# Patient Record
Sex: Female | Born: 1960 | Race: White | Hispanic: No | Marital: Married
Health system: Midwestern US, Community
[De-identification: ages and names within clinical notes are randomized; demographics above are authoritative.]

## PROBLEM LIST (undated history)

## (undated) DIAGNOSIS — Z8041 Family history of malignant neoplasm of ovary: Secondary | ICD-10-CM

## (undated) DIAGNOSIS — Z1231 Encounter for screening mammogram for malignant neoplasm of breast: Secondary | ICD-10-CM

## (undated) DIAGNOSIS — E7800 Pure hypercholesterolemia, unspecified: Secondary | ICD-10-CM

## (undated) DIAGNOSIS — R7303 Prediabetes: Secondary | ICD-10-CM

## (undated) DIAGNOSIS — K219 Gastro-esophageal reflux disease without esophagitis: Secondary | ICD-10-CM

## (undated) DIAGNOSIS — J40 Bronchitis, not specified as acute or chronic: Secondary | ICD-10-CM

## (undated) DIAGNOSIS — J189 Pneumonia, unspecified organism: Secondary | ICD-10-CM

## (undated) DIAGNOSIS — M25569 Pain in unspecified knee: Secondary | ICD-10-CM

---

## 1998-06-14 HISTORY — PX: BREAST CYST ASPIRATION: SHX578

## 2000-08-02 ENCOUNTER — Emergency Department (HOSPITAL_COMMUNITY): Admission: EM | Admit: 2000-08-02 | Discharge: 2000-08-02 | Payer: Self-pay | Admitting: Emergency Medicine

## 2000-08-05 ENCOUNTER — Emergency Department (HOSPITAL_COMMUNITY): Admission: EM | Admit: 2000-08-05 | Discharge: 2000-08-05 | Payer: Self-pay | Admitting: Emergency Medicine

## 2001-05-03 ENCOUNTER — Emergency Department (HOSPITAL_COMMUNITY): Admission: EM | Admit: 2001-05-03 | Discharge: 2001-05-03 | Payer: Self-pay

## 2002-01-18 ENCOUNTER — Emergency Department (HOSPITAL_COMMUNITY): Admission: EM | Admit: 2002-01-18 | Discharge: 2002-01-19 | Payer: Self-pay | Admitting: Emergency Medicine

## 2002-03-25 ENCOUNTER — Emergency Department (HOSPITAL_COMMUNITY): Admission: EM | Admit: 2002-03-25 | Discharge: 2002-03-25 | Payer: Self-pay | Admitting: Emergency Medicine

## 2002-03-25 ENCOUNTER — Encounter: Payer: Self-pay | Admitting: Emergency Medicine

## 2003-03-24 ENCOUNTER — Emergency Department (HOSPITAL_COMMUNITY): Admission: AD | Admit: 2003-03-24 | Discharge: 2003-03-24 | Payer: Self-pay | Admitting: Family Medicine

## 2003-03-25 ENCOUNTER — Emergency Department (HOSPITAL_COMMUNITY): Admission: AD | Admit: 2003-03-25 | Discharge: 2003-03-25 | Payer: Self-pay | Admitting: Family Medicine

## 2003-05-15 ENCOUNTER — Emergency Department (HOSPITAL_COMMUNITY): Admission: AD | Admit: 2003-05-15 | Discharge: 2003-05-15 | Payer: Self-pay | Admitting: Family Medicine

## 2003-05-17 ENCOUNTER — Emergency Department (HOSPITAL_COMMUNITY): Admission: AD | Admit: 2003-05-17 | Discharge: 2003-05-17 | Payer: Self-pay | Admitting: Family Medicine

## 2005-08-30 ENCOUNTER — Emergency Department (HOSPITAL_COMMUNITY): Admission: EM | Admit: 2005-08-30 | Discharge: 2005-08-30 | Payer: Self-pay | Admitting: Emergency Medicine

## 2005-11-11 ENCOUNTER — Emergency Department (HOSPITAL_COMMUNITY): Admission: EM | Admit: 2005-11-11 | Discharge: 2005-11-11 | Payer: Self-pay | Admitting: Family Medicine

## 2006-04-13 ENCOUNTER — Emergency Department (HOSPITAL_COMMUNITY): Admission: EM | Admit: 2006-04-13 | Discharge: 2006-04-13 | Payer: Self-pay | Admitting: Emergency Medicine

## 2007-07-12 ENCOUNTER — Emergency Department (HOSPITAL_COMMUNITY): Admission: EM | Admit: 2007-07-12 | Discharge: 2007-07-12 | Payer: Self-pay | Admitting: Family Medicine

## 2007-11-28 ENCOUNTER — Emergency Department (HOSPITAL_COMMUNITY): Admission: EM | Admit: 2007-11-28 | Discharge: 2007-11-28 | Payer: Self-pay | Admitting: Family Medicine

## 2009-03-01 ENCOUNTER — Emergency Department (HOSPITAL_COMMUNITY): Admission: EM | Admit: 2009-03-01 | Discharge: 2009-03-01 | Payer: Self-pay | Admitting: Family Medicine

## 2009-10-29 ENCOUNTER — Emergency Department (HOSPITAL_COMMUNITY): Admission: EM | Admit: 2009-10-29 | Discharge: 2009-10-29 | Payer: Self-pay | Admitting: Family Medicine

## 2010-04-28 ENCOUNTER — Emergency Department (HOSPITAL_COMMUNITY): Admission: EM | Admit: 2010-04-28 | Discharge: 2010-04-28 | Payer: Self-pay | Admitting: Family Medicine

## 2010-08-15 ENCOUNTER — Inpatient Hospital Stay (INDEPENDENT_AMBULATORY_CARE_PROVIDER_SITE_OTHER)
Admission: RE | Admit: 2010-08-15 | Discharge: 2010-08-15 | Disposition: A | Discharge status: Home / Self Care | Payer: BC Managed Care – PPO | Source: Ambulatory Visit | Attending: Family Medicine | Admitting: Family Medicine

## 2010-08-15 DIAGNOSIS — K089 Disorder of teeth and supporting structures, unspecified: Secondary | ICD-10-CM

## 2010-08-31 LAB — POCT URINALYSIS DIP (DEVICE)
Bilirubin Urine: NEGATIVE
Glucose, UA: NEGATIVE mg/dL
Hgb urine dipstick: NEGATIVE
Ketones, ur: NEGATIVE mg/dL
Nitrite: NEGATIVE
Protein, ur: NEGATIVE mg/dL
Specific Gravity, Urine: 1.02 (ref 1.005–1.030)
Urobilinogen, UA: 0.2 mg/dL (ref 0.0–1.0)
pH: 5.5 (ref 5.0–8.0)

## 2010-08-31 LAB — POCT PREGNANCY, URINE: Preg Test, Ur: NEGATIVE

## 2010-09-18 LAB — POCT URINALYSIS DIP (DEVICE)
Bilirubin Urine: NEGATIVE
Hgb urine dipstick: NEGATIVE
Nitrite: NEGATIVE
Protein, ur: NEGATIVE mg/dL
Urobilinogen, UA: 0.2 mg/dL (ref 0.0–1.0)
pH: 5 (ref 5.0–8.0)

## 2010-09-18 LAB — URINE CULTURE: Colony Count: NO GROWTH

## 2010-12-08 ENCOUNTER — Inpatient Hospital Stay (INDEPENDENT_AMBULATORY_CARE_PROVIDER_SITE_OTHER)
Admission: RE | Admit: 2010-12-08 | Discharge: 2010-12-08 | Disposition: A | Discharge status: Home / Self Care | Payer: BC Managed Care – PPO | Source: Ambulatory Visit | Attending: Emergency Medicine | Admitting: Emergency Medicine

## 2010-12-08 DIAGNOSIS — S058X9A Other injuries of unspecified eye and orbit, initial encounter: Secondary | ICD-10-CM

## 2010-12-09 ENCOUNTER — Inpatient Hospital Stay (INDEPENDENT_AMBULATORY_CARE_PROVIDER_SITE_OTHER)
Admission: RE | Admit: 2010-12-09 | Discharge: 2010-12-09 | Disposition: A | Discharge status: Home / Self Care | Payer: BC Managed Care – PPO | Source: Ambulatory Visit | Attending: Emergency Medicine | Admitting: Emergency Medicine

## 2010-12-09 DIAGNOSIS — S058X9A Other injuries of unspecified eye and orbit, initial encounter: Secondary | ICD-10-CM

## 2011-03-04 LAB — POCT RAPID STREP A: Streptococcus, Group A Screen (Direct): NEGATIVE

## 2012-09-07 ENCOUNTER — Emergency Department (HOSPITAL_COMMUNITY)
Admission: EM | Admit: 2012-09-07 | Discharge: 2012-09-07 | Disposition: A | Discharge status: Home / Self Care | Payer: PRIVATE HEALTH INSURANCE | Attending: Emergency Medicine | Admitting: Emergency Medicine

## 2012-09-07 ENCOUNTER — Emergency Department (INDEPENDENT_AMBULATORY_CARE_PROVIDER_SITE_OTHER)
Admission: EM | Admit: 2012-09-07 | Discharge: 2012-09-07 | Disposition: A | Discharge status: Nursing Home (Includes ICF) | Payer: PRIVATE HEALTH INSURANCE | Source: Home / Self Care | Attending: Emergency Medicine | Admitting: Emergency Medicine

## 2012-09-07 ENCOUNTER — Other Ambulatory Visit: Payer: Self-pay

## 2012-09-07 ENCOUNTER — Encounter (HOSPITAL_COMMUNITY): Payer: Self-pay | Admitting: Emergency Medicine

## 2012-09-07 ENCOUNTER — Encounter (HOSPITAL_COMMUNITY): Payer: Self-pay | Admitting: *Deleted

## 2012-09-07 DIAGNOSIS — F17200 Nicotine dependence, unspecified, uncomplicated: Secondary | ICD-10-CM | POA: Insufficient documentation

## 2012-09-07 DIAGNOSIS — R220 Localized swelling, mass and lump, head: Secondary | ICD-10-CM

## 2012-09-07 DIAGNOSIS — R51 Headache: Secondary | ICD-10-CM

## 2012-09-07 DIAGNOSIS — K044 Acute apical periodontitis of pulpal origin: Secondary | ICD-10-CM | POA: Insufficient documentation

## 2012-09-07 DIAGNOSIS — R55 Syncope and collapse: Secondary | ICD-10-CM

## 2012-09-07 DIAGNOSIS — K047 Periapical abscess without sinus: Secondary | ICD-10-CM

## 2012-09-07 DIAGNOSIS — R1110 Vomiting, unspecified: Secondary | ICD-10-CM

## 2012-09-07 LAB — CBC WITH DIFFERENTIAL/PLATELET
Basophils Absolute: 0 10*3/uL (ref 0.0–0.1)
HCT: 43.8 % (ref 36.0–46.0)
Lymphocytes Relative: 11 % — ABNORMAL LOW (ref 12–46)
Monocytes Absolute: 0.9 10*3/uL (ref 0.1–1.0)
Neutro Abs: 14.1 10*3/uL — ABNORMAL HIGH (ref 1.7–7.7)
Platelets: 186 10*3/uL (ref 150–400)
RDW: 13.2 % (ref 11.5–15.5)
WBC: 16.9 10*3/uL — ABNORMAL HIGH (ref 4.0–10.5)

## 2012-09-07 LAB — BASIC METABOLIC PANEL
CO2: 24 mEq/L (ref 19–32)
Chloride: 101 mEq/L (ref 96–112)
Sodium: 135 mEq/L (ref 135–145)

## 2012-09-07 MED ORDER — CLINDAMYCIN HCL 150 MG PO CAPS: 300.0000 mg | ORAL_CAPSULE | Freq: Three times a day (TID) | ORAL | Status: DC

## 2012-09-07 MED ORDER — DIPHENHYDRAMINE HCL 50 MG/ML IJ SOLN
12.5000 mg | Freq: Once | INTRAMUSCULAR | Status: AC
  Administered 2012-09-07: 12.5 mg via INTRAVENOUS
  Filled 2012-09-07: qty 1

## 2012-09-07 MED ORDER — PROCHLORPERAZINE EDISYLATE 5 MG/ML IJ SOLN
10.0000 mg | Freq: Once | INTRAMUSCULAR | Status: AC
  Administered 2012-09-07: 10 mg via INTRAVENOUS
  Filled 2012-09-07: qty 2

## 2012-09-07 MED ORDER — CLINDAMYCIN PHOSPHATE 600 MG/50ML IV SOLN
600.0000 mg | Freq: Once | INTRAVENOUS | Status: AC
  Administered 2012-09-07: 600 mg via INTRAVENOUS
  Filled 2012-09-07: qty 50

## 2012-09-07 MED ORDER — MORPHINE SULFATE 4 MG/ML IJ SOLN
4.0000 mg | Freq: Once | INTRAMUSCULAR | Status: AC
  Administered 2012-09-07: 4 mg via INTRAVENOUS
  Filled 2012-09-07: qty 1

## 2012-09-07 MED ORDER — OXYCODONE-ACETAMINOPHEN 5-325 MG PO TABS: 1.0000 | ORAL_TABLET | ORAL | Status: DC | PRN

## 2012-09-07 NOTE — ED Notes (Signed)
Pt transferred from urgent care, facial swelling, HA, dizziness, and vomiting since last night.

## 2012-09-07 NOTE — ED Notes (Signed)
PA at bedside.

## 2012-09-07 NOTE — ED Notes (Addendum)
Pt states she was seen in urgent care today with a right side tooth abscess. Pt states urgent care recommended IV antibiotics. Pt states not blood work or meds given to patient in urgent care. Pt states mouth pain has been hurting for 2 days. Pt not able keep food down. Pt states she also has a headache that has not gone down since yesterday. Pt now has swelling to the right side of face. Denies fever or sore throat. No difficulty swallowing or breathing. Pt states she also passed out his morning. Pt states no witness to the passing out. Not sure how long she was passed out.

## 2012-09-07 NOTE — ED Notes (Signed)
Pt also c/o front, lower toothache.

## 2012-09-07 NOTE — ED Provider Notes (Signed)
History     CSN: [id number]  Arrival date & time 09/07/12  1716   First MD Initiated Contact with Patient 09/07/12 1721      Chief Complaint  Patient presents with  . Facial Swelling    right facial swelling started last p.m due to dental problem.: headaches, nausea and vomiting that started today. fainting spell this a.m around 6:15    (Consider location/radiation/quality/duration/timing/severity/associated sxs/prior treatment) HPI Comments: Patient presents urgent care this evening complaining of and worsening right-sided facial swelling that extends to her right for head area from her maxillary region. Patient has vomited several times during the course of the day has been having a severe headache and feeling nauseous. Patient also describes that this morning she had a fainting spell have been taking Tylenol for her pain but it's not working. Patient denies any fevers. Describes it she currently feels nauseous and the swelling has gotten progressively worse in the last couple of hours. Patient looks ill  Patient is a 52 y.o. female presenting with tooth pain. The history is provided by the patient and a relative.  Dental PainThe primary symptoms include mouth pain and headaches. Primary symptoms do not include fever, shortness of breath, sore throat or cough. The symptoms began 12 to 24 hours ago. The symptoms are worsening. The symptoms occur constantly.  The headache is not associated with neck stiffness or weakness.  Additional symptoms include: dental sensitivity to temperature, gum swelling, gum tenderness, facial swelling and excessive salivation. Additional symptoms do not include: taste disturbance, ear pain, hearing loss, swollen glands, goiter and fatigue. Medical issues include: periodontal disease.    History reviewed. No pertinent past medical history.  History reviewed. No pertinent past surgical history.  History reviewed. No pertinent family history.  History    Substance Use Topics  . Smoking status: Not on file  . Smokeless tobacco: Not on file  . Alcohol Use: Not on file    OB History   Grav Para Term Preterm Abortions TAB SAB Ect Mult Living                  Review of Systems  Constitutional: Positive for activity change and appetite change. Negative for fever, chills, diaphoresis and fatigue.  HENT: Positive for facial swelling and dental problem. Negative for hearing loss, ear pain, sore throat, rhinorrhea, sneezing, mouth sores, neck pain, neck stiffness, postnasal drip, sinus pressure and ear discharge.   Respiratory: Negative for cough and shortness of breath.   Gastrointestinal: Positive for nausea and vomiting. Negative for abdominal pain.  Skin: Negative for color change and rash.  Neurological: Positive for dizziness, syncope, facial asymmetry and headaches. Negative for tremors, seizures, speech difficulty, weakness, light-headedness and numbness.    Allergies  Review of patient's allergies indicates no known allergies.  Home Medications  No current outpatient prescriptions on file.  There were no vitals taken for this visit.  Physical Exam  Nursing note and vitals reviewed. Constitutional: She is oriented to person, place, and time. Vital signs are normal.  Non-toxic appearance. She does not have a sickly appearance. She appears ill. She appears distressed.  HENT:  Head: Normocephalic.    Mouth/Throat:    Eyes: Left eye exhibits no discharge. No scleral icterus.  Neck: Neck supple.  Neurological: She is alert and oriented to person, place, and time.  Skin: No rash noted. No erythema.    ED Course  Procedures (including critical care time)  Labs Reviewed - No data to display No  results found.   1. Abscess, dental   2. Facial swelling   3. Vomiting   4. Syncopal episodes       MDM  Significant facial swelling from a dental abscess. Patient is also experiencing a severe headache associated with  nausea and vomiting. And is reporting a syncopal episode at home. Patient will be transferred to the emergency department for consideration of IV antibiotics and further monitoring observation and oral surgeon consult-  Jimmie Molly, MD 09/07/12 1901

## 2012-09-07 NOTE — ED Notes (Signed)
Pt c/o right sided facial swelling that started last night due to dental problem with the upper right tooth  Headache. Nausea. And vomiting. That started today. Pt states that she had a fainting spell this a.m around 6:15 a.m  Pt has been taking tylenol 3 for pain last dose was at 11:55 p.m and just recently finished antibiotic for a sinus infection.

## 2012-09-07 NOTE — ED Provider Notes (Signed)
History     CSN: [id number]  Arrival date & time 09/07/12  1910   First MD Initiated Contact with Patient 09/07/12 2015      Chief Complaint  Patient presents with  . [facility] transfer   . Facial Swelling    (Consider location/radiation/quality/duration/timing/severity/associated sxs/prior treatment) HPI History provided by pt.   Pt transferred from [facility]; multiple complaints today.  Has had a right upper toothache for the past two days.  Increased in severity over night, and woke this morning w/ right facial edema.  Pain is worst along right side of nose currently.  No relief w/ tylenol #3.  Developed a constant, throbbing pain on top of head over night as well.  Gradual in onset. No associated fever, vision changes, photo/phonophobia, dizziness, extremity weakness/paresthesias.  H/o migraines but this pain feels worse.  Denies head trauma.  Also c/o syncopal episode that occurred this morning while walking to her bedroom.  Preceded by lightheadedness and vision going black.  Unwitnessed.  No prior history.  Had severe facial pain at time of episode.  Has not had CP, SOB or palpitations, and other than dental pain/facial edema, no recent illnesses.  No recent medication changes.   History reviewed. No pertinent past medical history.  Past Surgical History  Procedure Laterality Date  . Cesarean section      History reviewed. No pertinent family history.  History  Substance Use Topics  . Smoking status: Current Every Day Smoker -- 0.50 packs/day    Types: Cigarettes  . Smokeless tobacco: Not on file  . Alcohol Use: No    OB History   Grav Para Term Preterm Abortions TAB SAB Ect Mult Living                  Review of Systems  All other systems reviewed and are negative.    Allergies  Novocain  Home Medications  No current outpatient prescriptions on file.  BP 152/71  Pulse 84  Temp(Src) 98.2 F (36.8 C) (Oral)  Resp 16  SpO2 96%  Physical Exam  Nursing note and  vitals reviewed. Constitutional: She is oriented to person, place, and time. She appears well-developed and well-nourished.  HENT:  Head: Normocephalic and atraumatic.  Induration and edema of cheek, along right side of nose.  Ttp.  No tenderness of inferior orbit or pain w/ ROM of EOMs.  Diffuse, advanced caries.  Pt points to pain at right upper lateral incisor.  Mild ttp of this tooth.  Mild adjacent gingivitis.  No trismus.     Eyes:  Normal appearance  Neck: Normal range of motion. Neck supple. No rigidity. No Brudzinski's sign and no Kernig's sign noted.  Cardiovascular: Normal rate, regular rhythm and intact distal pulses.   Pulmonary/Chest: Effort normal and breath sounds normal.  Abdominal: Soft. Bowel sounds are normal. She exhibits no distension. There is no tenderness.  Musculoskeletal: Normal range of motion.  Lymphadenopathy:    She has no cervical adenopathy.  Neurological: She is alert and oriented to person, place, and time. No sensory deficit. Coordination normal.  CN 3-12 intact.  No nystagmus.  5/5 and equal upper and lower extremity strength.  No past pointing.    Skin: Skin is warm and dry. No rash noted.  Psychiatric: She has a normal mood and affect. Her behavior is normal.    ED Course  Procedures (including critical care time)   Date: 09/07/2012  Rate: 95  Rhythm: normal sinus rhythm  QRS Axis: normal  Intervals: normal  ST/T Wave abnormalities: normal  Conduction Disutrbances:none  Narrative Interpretation:   Old EKG Reviewed: none available   Labs Reviewed  CBC WITH DIFFERENTIAL - Abnormal; Notable for the following:    WBC 16.9 (*)    Hemoglobin 15.3 (*)    Neutrophils Relative 84 (*)    Neutro Abs 14.1 (*)    Lymphocytes Relative 11 (*)    All other components within normal limits  BASIC METABOLIC PANEL - Abnormal; Notable for the following:    Glucose, Bld 101 (*)    All other components within normal limits   No results found.   1.  Dental infection   2. Syncope   3. Headache       MDM  Pt transferred from [facility] for treatment of suspected periapical abscess w/ significant right facial edema, as well as non-traumatic headache and syncopal episode.  Syncope preceded by lightheadedness and vision going black; occurred in setting of severe facial pain; no recent CP/SOB/palpitations, other recent illnesses or recent medication changes.  Suspect that it was vasovagal and related to pain, but EKG and basic labs pending.  Pt well appearing, afebrile, no focal neuro deficits or meningeal signs on exam.  Will treat headache w/ IV compazine.  She is also receiving IV clinda for dental infection.    EKG shows NSR and is non-ischemic.  Labs unremarkable w/ exception of leukocytosis.  Headache has resolved and facial pain much improved.  VSS.  Referred to dentist on call and prescribed clinda and percocet.  Return precautions discussed. 10:53 PM         Otilio Miu, PA-C 09/07/12 [phone]

## 2012-09-07 NOTE — ED Notes (Signed)
Pt sent to the emergency department for consideration of IV antibiotics and further monitoring observation and oral surgeon consult-

## 2012-09-08 NOTE — ED Provider Notes (Signed)
Medical screening examination/treatment/procedure(s) were performed by non-physician practitioner and as supervising physician I was immediately available for consultation/collaboration.   Gwyneth Sprout, MD 09/08/12 [phone]

## 2013-07-24 ENCOUNTER — Emergency Department (HOSPITAL_COMMUNITY)
Admission: EM | Admit: 2013-07-24 | Discharge: 2013-07-24 | Disposition: A | Discharge status: Home / Self Care | Payer: PRIVATE HEALTH INSURANCE | Source: Home / Self Care | Attending: Family Medicine | Admitting: Family Medicine

## 2013-07-24 ENCOUNTER — Encounter (HOSPITAL_COMMUNITY): Payer: Self-pay | Admitting: Emergency Medicine

## 2013-07-24 DIAGNOSIS — A084 Viral intestinal infection, unspecified: Secondary | ICD-10-CM

## 2013-07-24 DIAGNOSIS — A088 Other specified intestinal infections: Secondary | ICD-10-CM

## 2013-07-24 MED ORDER — ONDANSETRON 4 MG PO TBDP
8.0000 mg | ORAL_TABLET | Freq: Once | ORAL | Status: AC
  Administered 2013-07-24: 8 mg via ORAL

## 2013-07-24 MED ORDER — ONDANSETRON 4 MG PO TBDP
ORAL_TABLET | ORAL | Status: AC
  Filled 2013-07-24: qty 2

## 2013-07-24 MED ORDER — ONDANSETRON HCL 4 MG PO TABS: 4.0000 mg | ORAL_TABLET | Freq: Four times a day (QID) | ORAL | Status: DC

## 2013-07-24 NOTE — ED Provider Notes (Signed)
CSN: [id number]     Arrival date & time 07/24/13  1017 History   None    Chief Complaint  Patient presents with  . Abdominal Pain     (Consider location/radiation/quality/duration/timing/severity/associated sxs/prior Treatment) Patient is a 53 y.o. female presenting with abdominal pain. The history is provided by the patient.  Abdominal Pain Pain location:  Generalized Pain quality: cramping   Pain radiates to:  Does not radiate Pain severity:  Mild Onset quality:  Gradual Duration:  5 days Timing:  Intermittent Progression:  Improving Chronicity:  New Associated symptoms: diarrhea, nausea and vomiting   Associated symptoms: no chills, no fever, no hematemesis and no hematochezia     History reviewed. No pertinent past medical history. Past Surgical History  Procedure Laterality Date  . Cesarean section     No family history on file. History  Substance Use Topics  . Smoking status: Current Every Day Smoker -- 0.50 packs/day    Types: Cigarettes  . Smokeless tobacco: Not on file  . Alcohol Use: No   OB History   Grav Para Term Preterm Abortions TAB SAB Ect Mult Living                 Review of Systems  Constitutional: Positive for activity change and appetite change. Negative for fever and chills.  Gastrointestinal: Positive for nausea, vomiting, abdominal pain and diarrhea. Negative for hematochezia and hematemesis.  Genitourinary: Negative.   Skin: Negative.       Allergies  Novocain  Home Medications   Current Outpatient Rx  Name  Route  Sig  Dispense  Refill  . Aspirin-Salicylamide-Caffeine (BC HEADACHE POWDER PO)   Oral   Take by mouth.         . clindamycin (CLEOCIN) 150 MG capsule   Oral   Take 2 capsules (300 mg total) by mouth 3 (three) times daily.   42 capsule   0   . ondansetron (ZOFRAN) 4 MG tablet   Oral   Take 1 tablet (4 mg total) by mouth every 6 (six) hours. Prn n/v   6 tablet   0   . oxyCODONE-acetaminophen  (PERCOCET/ROXICET) 5-325 MG per tablet   Oral   Take 1 tablet by mouth every 4 (four) hours as needed for pain.   20 tablet   0    BP 139/82  Pulse 74  Temp(Src) 97.7 F (36.5 C) (Oral)  Resp 20  SpO2 95% Physical Exam  Nursing note and vitals reviewed. Constitutional: She is oriented to person, place, and time. She appears well-developed and well-nourished.  HENT:  Mouth/Throat: Oropharynx is clear and moist.  Neck: Normal range of motion. Neck supple.  Abdominal: Soft. Bowel sounds are normal. She exhibits no distension and no mass. There is no tenderness. There is no rebound and no guarding.  Lymphadenopathy:    She has no cervical adenopathy.  Neurological: She is alert and oriented to person, place, and time.  Skin: Skin is warm and dry.    ED Course  Procedures (including critical care time) Labs Review Labs Reviewed - No data to display Imaging Review No results found.    MDM   Final diagnoses:  Gastroenteritis and colitis, viral      Linna HoffJames D Jany Buckwalter, MD 07/25/13 2014

## 2013-07-24 NOTE — ED Notes (Signed)
Reports abdominal pain, n/v/d started on Wednesday.  Vomiting stopped on Sunday, diarrhea ended yesterday, today continued abdominal pain and nausea

## 2013-07-24 NOTE — Discharge Instructions (Signed)
Clear liquid , bland diet tonight as tolerated, advance on wed as improved, use medicine as needed, probiotic-Align or activia yogurt,  return or see your doctor if any problems.

## 2013-08-13 ENCOUNTER — Emergency Department (HOSPITAL_COMMUNITY)
Admission: EM | Admit: 2013-08-13 | Discharge: 2013-08-13 | Disposition: A | Discharge status: Home / Self Care | Payer: PRIVATE HEALTH INSURANCE | Source: Home / Self Care

## 2013-08-13 ENCOUNTER — Encounter (HOSPITAL_COMMUNITY): Payer: Self-pay | Admitting: Emergency Medicine

## 2013-08-13 DIAGNOSIS — R059 Cough, unspecified: Secondary | ICD-10-CM

## 2013-08-13 DIAGNOSIS — Z720 Tobacco use: Secondary | ICD-10-CM

## 2013-08-13 DIAGNOSIS — J069 Acute upper respiratory infection, unspecified: Secondary | ICD-10-CM

## 2013-08-13 DIAGNOSIS — J9801 Acute bronchospasm: Secondary | ICD-10-CM

## 2013-08-13 DIAGNOSIS — F17200 Nicotine dependence, unspecified, uncomplicated: Secondary | ICD-10-CM

## 2013-08-13 DIAGNOSIS — R05 Cough: Secondary | ICD-10-CM

## 2013-08-13 MED ORDER — IPRATROPIUM-ALBUTEROL 0.5-2.5 (3) MG/3ML IN SOLN
3.0000 mL | Freq: Once | RESPIRATORY_TRACT | Status: AC
  Administered 2013-08-13: 3 mL via RESPIRATORY_TRACT

## 2013-08-13 MED ORDER — HYDROCODONE-ACETAMINOPHEN 5-325 MG PO TABS
ORAL_TABLET | ORAL | Status: AC
  Filled 2013-08-13: qty 1

## 2013-08-13 MED ORDER — HYDROCODONE-ACETAMINOPHEN 5-325 MG PO TABS
1.0000 | ORAL_TABLET | Freq: Once | ORAL | Status: AC
  Administered 2013-08-13: 1 via ORAL

## 2013-08-13 MED ORDER — KETOROLAC TROMETHAMINE 30 MG/ML IJ SOLN
INTRAMUSCULAR | Status: AC
  Filled 2013-08-13: qty 1

## 2013-08-13 MED ORDER — PREDNISONE 20 MG PO TABS: ORAL_TABLET | ORAL | Status: DC

## 2013-08-13 MED ORDER — ALBUTEROL SULFATE HFA 108 (90 BASE) MCG/ACT IN AERS: 2.0000 | INHALATION_SPRAY | RESPIRATORY_TRACT | Status: DC | PRN

## 2013-08-13 MED ORDER — IPRATROPIUM-ALBUTEROL 0.5-2.5 (3) MG/3ML IN SOLN
RESPIRATORY_TRACT | Status: AC
  Filled 2013-08-13: qty 3

## 2013-08-13 MED ORDER — TRIAMCINOLONE ACETONIDE 40 MG/ML IJ SUSP
40.0000 mg | Freq: Once | INTRAMUSCULAR | Status: AC
  Administered 2013-08-13: 40 mg via INTRAMUSCULAR

## 2013-08-13 MED ORDER — TRIAMCINOLONE ACETONIDE 40 MG/ML IJ SUSP
INTRAMUSCULAR | Status: AC
  Filled 2013-08-13: qty 1

## 2013-08-13 MED ORDER — ALBUTEROL SULFATE (2.5 MG/3ML) 0.083% IN NEBU
INHALATION_SOLUTION | RESPIRATORY_TRACT | Status: AC
  Filled 2013-08-13: qty 3

## 2013-08-13 MED ORDER — KETOROLAC TROMETHAMINE 30 MG/ML IJ SOLN
30.0000 mg | Freq: Once | INTRAMUSCULAR | Status: AC
  Administered 2013-08-13: 30 mg via INTRAMUSCULAR

## 2013-08-13 MED ORDER — ALBUTEROL SULFATE (2.5 MG/3ML) 0.083% IN NEBU
2.5000 mg | INHALATION_SOLUTION | Freq: Once | RESPIRATORY_TRACT | Status: AC
  Administered 2013-08-13: 2.5 mg via RESPIRATORY_TRACT

## 2013-08-13 NOTE — ED Provider Notes (Signed)
CSN: [id number]     Arrival date & time 08/13/13  1728 History   First MD Initiated Contact with Patient 08/13/13 1930     Chief Complaint  Patient presents with  . Headache   (Consider location/radiation/quality/duration/timing/severity/associated sxs/prior Treatment) HPI Comments: 53 year old female appears 10 years older than stated age is complaining of headache, cough and upper respiratory congestion After spraining the bathroom and Lysol last night. She has a long history of smoking, but never  diagnosed with COPD.   History reviewed. No pertinent past medical history. Past Surgical History  Procedure Laterality Date  . Cesarean section     No family history on file. History  Substance Use Topics  . Smoking status: Current Every Day Smoker -- 0.50 packs/day    Types: Cigarettes  . Smokeless tobacco: Not on file  . Alcohol Use: No   OB History   Grav Para Term Preterm Abortions TAB SAB Ect Mult Living                 Review of Systems  Constitutional: Positive for activity change. Negative for fever, chills, appetite change and fatigue.  HENT: Positive for congestion, postnasal drip and rhinorrhea. Negative for facial swelling and sore throat.   Eyes: Negative.   Respiratory: Positive for cough.   Cardiovascular: Negative.   Gastrointestinal: Negative.   Genitourinary: Negative.   Musculoskeletal: Negative for neck pain and neck stiffness.  Skin: Negative for pallor and rash.  Neurological: Positive for headaches. Negative for tremors, syncope, speech difficulty, light-headedness and numbness.       H/A located frontal region.  No focal weakness or paresthesias No problems with Vision, speech, hearing or swallowing.     Allergies  Novocain  Home Medications   Current Outpatient Rx  Name  Route  Sig  Dispense  Refill  . albuterol (PROVENTIL HFA;VENTOLIN HFA) 108 (90 BASE) MCG/ACT inhaler   Inhalation   Inhale 2 puffs into the lungs every 4 (four) hours as  needed for wheezing or shortness of breath.   1 Inhaler   0   . Aspirin-Salicylamide-Caffeine (BC HEADACHE POWDER PO)   Oral   Take by mouth.         . clindamycin (CLEOCIN) 150 MG capsule   Oral   Take 2 capsules (300 mg total) by mouth 3 (three) times daily.   42 capsule   0   . ondansetron (ZOFRAN) 4 MG tablet   Oral   Take 1 tablet (4 mg total) by mouth every 6 (six) hours. Prn n/v   6 tablet   0   . oxyCODONE-acetaminophen (PERCOCET/ROXICET) 5-325 MG per tablet   Oral   Take 1 tablet by mouth every 4 (four) hours as needed for pain.   20 tablet   0   . predniSONE (DELTASONE) 20 MG tablet      3 tabs po x 2 days, 2 tabs q d x 3 d, 1 po x 3 d.   15 tablet   0    BP 143/80  Temp(Src) 98.4 F (36.9 C) (Oral)  SpO2 98% Physical Exam  Nursing note and vitals reviewed. Constitutional: She is oriented to person, place, and time. She appears well-developed and well-nourished. No distress.  HENT:  Mouth/Throat: Oropharynx is clear and moist.  TM's nl OP with clear PND  Eyes: Conjunctivae and EOM are normal.  Neck: Normal range of motion. Neck supple.  Cardiovascular: Normal rate, regular rhythm and normal heart sounds.   Pulmonary/Chest: Effort normal.  No respiratory distress. She has wheezes.  Prolonged expir phase Diffuse bilateral expiratory wheezing  Musculoskeletal: Normal range of motion. She exhibits no edema.  Lymphadenopathy:    She has no cervical adenopathy.  Neurological: She is alert and oriented to person, place, and time.  Skin: Skin is warm and dry. No rash noted.  Psychiatric: She has a normal mood and affect.    ED Course  Procedures (including critical care time) Labs Review Labs Reviewed - No data to display Imaging Review No results found.   MDM   1. Cough   2. Bronchospasm   3. URI (upper respiratory infection)   4. Tobacco abuse     1805 hours: Patient is completed her DuoNeb and states that she had minimal relief. She can  still feel the shortness of breath and wheeze. Auscultation reveals improved air movement and a decrease with wheeze but poorly she could do better with a second albuterol neb. Received injection of Toradol 30 mg and Tylenol 40 mg IM Norco 5 mg po. Improved air movement , no wheezes after 2nd albuterol neb. Rx alb HFA Prednisone taper Alka Seltzer Cold Plus Night for URI Ibuprofen    Hayden Rasmussenavid Shiro Ellerman, NP 08/13/13 2043

## 2013-08-13 NOTE — Discharge Instructions (Signed)
Bronchospasm, Adult A bronchospasm is a spasm or tightening of the airways going into the lungs. During a bronchospasm breathing becomes more difficult because the airways get smaller. When this happens there can be coughing, a whistling sound when breathing (wheezing), and difficulty breathing. Bronchospasm is often associated with asthma, but not all patients who experience a bronchospasm have asthma. CAUSES  A bronchospasm is caused by inflammation or irritation of the airways. The inflammation or irritation may be triggered by:   Allergies (such as to animals, pollen, food, or mold). Allergens that cause bronchospasm may cause wheezing immediately after exposure or many hours later.   Infection. Viral infections are believed to be the most common cause of bronchospasm.   Exercise.   Irritants (such as pollution, cigarette smoke, strong odors, aerosol sprays, and paint fumes).   Weather changes. Winds increase molds and pollens in the air. Rain refreshes the air by washing irritants out. Cold air may cause inflammation.   Stress and emotional upset.  SIGNS AND SYMPTOMS   Wheezing.   Excessive nighttime coughing.   Frequent or severe coughing with a simple cold.   Chest tightness.   Shortness of breath.  DIAGNOSIS  Bronchospasm is usually diagnosed through a history and physical exam. Tests, such as chest X-rays, are sometimes done to look for other conditions. TREATMENT   Inhaled medicines can be given to open up your airways and help you breathe. The medicines can be given using either an inhaler or a nebulizer machine.  Corticosteroid medicines may be given for severe bronchospasm, usually when it is associated with asthma. HOME CARE INSTRUCTIONS   Always have a plan prepared for seeking medical care. Know when to call your health care provider and local emergency services (911 in the [place] Know where you can access local emergency care.  Only take medicines as  directed by your health care provider.  If you were prescribed an inhaler or nebulizer machine, ask your health care provider to explain how to use it correctly. Always use a spacer with your inhaler if you were given one.  It is necessary to remain calm during an attack. Try to relax and breathe more slowly.  Control your home environment in the following ways:   Change your heating and air conditioning filter at least once a month.   Limit your use of fireplaces and wood stoves.  Do not smoke and do not allow smoking in your home.   Avoid exposure to perfumes and fragrances.   Get rid of pests (such as roaches and mice) and their droppings.   Throw away plants if you see mold on them.   Keep your house clean and dust free.   Replace carpet with wood, tile, or vinyl flooring. Carpet can trap dander and dust.   Use allergy-proof pillows, mattress covers, and box spring covers.   Wash bed sheets and blankets every week in hot water and dry them in a dryer.   Use blankets that are made of polyester or cotton.   Wash hands frequently. SEEK MEDICAL CARE IF:   You have muscle aches.   You have chest pain.   The sputum changes from clear or white to yellow, green, gray, or bloody.   The sputum you cough up gets thicker.   There are problems that may be related to the medicine you are given, such as a rash, itching, swelling, or trouble breathing.  SEEK IMMEDIATE MEDICAL CARE IF:   You have worsening wheezing and coughing  even after taking your prescribed medicines.   You have increased difficulty breathing.   You develop severe chest pain. MAKE SURE YOU:   Understand these instructions.  Will watch your condition.  Will get help right away if you are not doing well or get worse. Document Released: 06/03/2003 Document Revised: 01/31/2013 Document Reviewed: 11/20/2012 [facility] Patient Information 2014 [place], [place].  Cough, Adult  A cough is a  reflex that helps clear your throat and airways. It can help heal the body or may be a reaction to an irritated airway. A cough may only last 2 or 3 weeks (acute) or may last more than 8 weeks (chronic).  CAUSES Acute cough:  Viral or bacterial infections. Chronic cough:  Infections.  Allergies.  Asthma.  Post-nasal drip.  Smoking.  Heartburn or acid reflux.  Some medicines.  Chronic lung problems (COPD).  Cancer. SYMPTOMS   Cough.  Fever.  Chest pain.  Increased breathing rate.  High-pitched whistling sound when breathing (wheezing).  Colored mucus that you cough up (sputum). TREATMENT   A bacterial cough may be treated with antibiotic medicine.  A viral cough must run its course and will not respond to antibiotics.  Your caregiver may recommend other treatments if you have a chronic cough. HOME CARE INSTRUCTIONS   Only take over-the-counter or prescription medicines for pain, discomfort, or fever as directed by your caregiver. Use cough suppressants only as directed by your caregiver.  Use a cold steam vaporizer or humidifier in your bedroom or home to help loosen secretions.  Sleep in a semi-upright position if your cough is worse at night.  Rest as needed.  Stop smoking if you smoke. SEEK IMMEDIATE MEDICAL CARE IF:   You have pus in your sputum.  Your cough starts to worsen.  You cannot control your cough with suppressants and are losing sleep.  You begin coughing up blood.  You have difficulty breathing.  You develop pain which is getting worse or is uncontrolled with medicine.  You have a fever. MAKE SURE YOU:   Understand these instructions.  Will watch your condition.  Will get help right away if you are not doing well or get worse. Document Released: 11/27/2010 Document Revised: 08/23/2011 Document Reviewed: 11/27/2010 [facility] Patient Information 2014 [place], [place].  How to Use an Inhaler Proper inhaler technique is very  important. Good technique ensures that the medicine reaches the lungs. Poor technique results in depositing the medicine on the tongue and back of the throat rather than in the airways. If you do not use the inhaler with good technique, the medicine will not help you. STEPS TO FOLLOW IF USING AN INHALER WITHOUT AN EXTENSION TUBE 1. Remove the cap from the inhaler. 2. If you are using the inhaler for the first time, you will need to prime it. Shake the inhaler for 5 seconds and release four puffs into the air, away from your face. Ask your health care provider or pharmacist if you have questions about priming your inhaler. 3. Shake the inhaler for 5 seconds before each breath in (inhalation). 4. Position the inhaler so that the top of the canister faces up. 5. Put your index finger on the top of the medicine canister. Your thumb supports the bottom of the inhaler. 6. Open your mouth. 7. Either place the inhaler between your teeth and place your lips tightly around the mouthpiece, or hold the inhaler 1 2 inches away from your open mouth. If you are unsure of which technique to  use, ask your health care provider. 8. Breathe out (exhale) normally and as completely as possible. 9. Press the canister down with your index finger to release the medicine. 10. At the same time as the canister is pressed, inhale deeply and slowly until your lungs are completely filled. This should take 4 6 seconds. Keep your tongue down. 11. Hold the medicine in your lungs for 5 10 seconds (10 seconds is best). This helps the medicine get into the small airways of your lungs. 12. Breathe out slowly, through pursed lips. Whistling is an example of pursed lips. 13. Wait at least 15 30 seconds between puffs. Continue with the above steps until you have taken the number of puffs your health care provider has ordered. Do not use the inhaler more than your health care provider tells you. 14. Replace the cap on the inhaler. 15. Follow  the directions from your health care provider or the inhaler insert for cleaning the inhaler. STEPS TO FOLLOW IF USING AN INHALER WITH AN EXTENSION (SPACER) 1. Remove the cap from the inhaler. 2. If you are using the inhaler for the first time, you will need to prime it. Shake the inhaler for 5 seconds and release four puffs into the air, away from your face. Ask your health care provider or pharmacist if you have questions about priming your inhaler. 3. Shake the inhaler for 5 seconds before each breath in (inhalation). 4. Place the open end of the spacer onto the mouthpiece of the inhaler. 5. Position the inhaler so that the top of the canister faces up and the spacer mouthpiece faces you. 6. Put your index finger on the top of the medicine canister. Your thumb supports the bottom of the inhaler and the spacer. 7. Breathe out (exhale) normally and as completely as possible. 8. Immediately after exhaling, place the spacer between your teeth and into your mouth. Close your lips tightly around the spacer. 9. Press the canister down with your index finger to release the medicine. 10. At the same time as the canister is pressed, inhale deeply and slowly until your lungs are completely filled. This should take 4 6 seconds. Keep your tongue down and out of the way. 11. Hold the medicine in your lungs for 5 10 seconds (10 seconds is best). This helps the medicine get into the small airways of your lungs. Exhale. 12. Repeat inhaling deeply through the spacer mouthpiece. Again hold that breath for up to 10 seconds (10 seconds is best). Exhale slowly. If it is difficult to take this second deep breath through the spacer, breathe normally several times through the spacer. Remove the spacer from your mouth. 13. Wait at least 15 30 seconds between puffs. Continue with the above steps until you have taken the number of puffs your health care provider has ordered. Do not use the inhaler more than your health care  provider tells you. 14. Remove the spacer from the inhaler, and place the cap on the inhaler. 15. Follow the directions from your health care provider or the inhaler insert for cleaning the inhaler and spacer. If you are using different kinds of inhalers, use your quick relief medicine to open the airways 10 15 minutes before using a steroid if instructed to do so by your health care provider. If you are unsure which inhalers to use and the order of using them, ask your health care provider, nurse, or respiratory therapist. If you are using a steroid inhaler, always rinse your mouth  with water after your last puff, then gargle and spit out the water. Do not swallow the water. AVOID:  Inhaling before or after starting the spray of medicine. It takes practice to coordinate your breathing with triggering the spray.  Inhaling through the nose (rather than the mouth) when triggering the spray. HOW TO DETERMINE IF YOUR INHALER IS FULL OR NEARLY EMPTY You cannot know when an inhaler is empty by shaking it. A few inhalers are now being made with dose counters. Ask your health care provider for a prescription that has a dose counter if you feel you need that extra help. If your inhaler does not have a counter, ask your health care provider to help you determine the date you need to refill your inhaler. Write the refill date on a calendar or your inhaler canister. Refill your inhaler 7 10 days before it runs out. Be sure to keep an adequate supply of medicine. This includes making sure it is not expired, and that you have a spare inhaler.  SEEK MEDICAL CARE IF:   Your symptoms are only partially relieved with your inhaler.  You are having trouble using your inhaler.  You have some increase in phlegm. SEEK IMMEDIATE MEDICAL CARE IF:   You feel little or no relief with your inhalers. You are still wheezing and are feeling shortness of breath or tightness in your chest or both.  You have dizziness,  headaches, or a fast heart rate.  You have chills, fever, or night sweats.  You have a noticeable increase in phlegm production, or there is blood in the phlegm. MAKE SURE YOU:   Understand these instructions.  Will watch your condition.  Will get help right away if you are not doing well or get worse. Document Released: 05/28/2000 Document Revised: 03/21/2013 Document Reviewed: 12/28/2012 [facility] Patient Information 2014 [place].  Smoking Cessation Quitting smoking is important to your health and has many advantages. However, it is not always easy to quit since nicotine is a very addictive drug. Often times, people try 3 times or more before being able to quit. This document explains the best ways for you to prepare to quit smoking. Quitting takes hard work and a lot of effort, but you can do it. ADVANTAGES OF QUITTING SMOKING  You will live longer, feel better, and live better.  Your body will feel the impact of quitting smoking almost immediately.  Within 20 minutes, blood pressure decreases. Your pulse returns to its normal level.  After 8 hours, carbon monoxide levels in the blood return to normal. Your oxygen level increases.  After 24 hours, the chance of having a heart attack starts to decrease. Your breath, hair, and body stop smelling like smoke.  After 48 hours, damaged nerve endings begin to recover. Your sense of taste and smell improve.  After 72 hours, the body is virtually free of nicotine. Your bronchial tubes relax and breathing becomes easier.  After 2 to 12 weeks, lungs can hold more air. Exercise becomes easier and circulation improves.  The risk of having a heart attack, stroke, cancer, or lung disease is greatly reduced.  After 1 year, the risk of coronary heart disease is cut in half.  After 5 years, the risk of stroke falls to the same as a nonsmoker.  After 10 years, the risk of lung cancer is cut in half and the risk of other cancers  decreases significantly.  After 15 years, the risk of coronary heart disease drops, usually to  the level of a nonsmoker.  If you are pregnant, quitting smoking will improve your chances of having a healthy baby.  The people you live with, especially any children, will be healthier.  You will have extra money to spend on things other than cigarettes. QUESTIONS TO THINK ABOUT BEFORE ATTEMPTING TO QUIT You may want to talk about your answers with your caregiver.  Why do you want to quit?  If you tried to quit in the past, what helped and what did not?  What will be the most difficult situations for you after you quit? How will you plan to handle them?  Who can help you through the tough times? Your family? Friends? A caregiver?  What pleasures do you get from smoking? What ways can you still get pleasure if you quit? Here are some questions to ask your caregiver:  How can you help me to be successful at quitting?  What medicine do you think would be best for me and how should I take it?  What should I do if I need more help?  What is smoking withdrawal like? How can I get information on withdrawal? GET READY  Set a quit date.  Change your environment by getting rid of all cigarettes, ashtrays, matches, and lighters in your home, car, or work. Do not let people smoke in your home.  Review your past attempts to quit. Think about what worked and what did not. GET SUPPORT AND ENCOURAGEMENT You have a better chance of being successful if you have help. You can get support in many ways.  Tell your family, friends, and co-workers that you are going to quit and need their support. Ask them not to smoke around you.  Get individual, group, or telephone counseling and support. Programs are available at [employer] and health centers. Call your local health department for information about programs in your area.  Spiritual beliefs and practices may help some smokers quit.  Download  a "quit meter" on your computer to keep track of quit statistics, such as how long you have gone without smoking, cigarettes not smoked, and money saved.  Get a self-help book about quitting smoking and staying off of tobacco. LEARN NEW SKILLS AND BEHAVIORS  Distract yourself from urges to smoke. Talk to someone, go for a walk, or occupy your time with a task.  Change your normal routine. Take a different route to work. Drink tea instead of coffee. Eat breakfast in a different place.  Reduce your stress. Take a hot bath, exercise, or read a book.  Plan something enjoyable to do every day. Reward yourself for not smoking.  Explore interactive web-based programs that specialize in helping you quit. GET MEDICINE AND USE IT CORRECTLY Medicines can help you stop smoking and decrease the urge to smoke. Combining medicine with the above behavioral methods and support can greatly increase your chances of successfully quitting smoking.  Nicotine replacement therapy helps deliver nicotine to your body without the negative effects and risks of smoking. Nicotine replacement therapy includes nicotine gum, lozenges, inhalers, nasal sprays, and skin patches. Some may be available over-the-counter and others require a prescription.  Antidepressant medicine helps people abstain from smoking, but how this works is unknown. This medicine is available by prescription.  Nicotinic receptor partial agonist medicine simulates the effect of nicotine in your brain. This medicine is available by prescription. Ask your caregiver for advice about which medicines to use and how to use them based on your health history.  Your caregiver will tell you what side effects to look out for if you choose to be on a medicine or therapy. Carefully read the information on the package. Do not use any other product containing nicotine while using a nicotine replacement product.  RELAPSE OR DIFFICULT SITUATIONS Most relapses occur within  the first 3 months after quitting. Do not be discouraged if you start smoking again. Remember, most people try several times before finally quitting. You may have symptoms of withdrawal because your body is used to nicotine. You may crave cigarettes, be irritable, feel very hungry, cough often, get headaches, or have difficulty concentrating. The withdrawal symptoms are only temporary. They are strongest when you first quit, but they will go away within 10 14 days. To reduce the chances of relapse, try to:  Avoid drinking alcohol. Drinking lowers your chances of successfully quitting.  Reduce the amount of caffeine you consume. Once you quit smoking, the amount of caffeine in your body increases and can give you symptoms, such as a rapid heartbeat, sweating, and anxiety.  Avoid smokers because they can make you want to smoke.  Do not let weight gain distract you. Many smokers will gain weight when they quit, usually less than 10 pounds. Eat a healthy diet and stay active. You can always lose the weight gained after you quit.  Find ways to improve your mood other than smoking. FOR MORE INFORMATION  www.smokefree.gov  Document Released: 05/25/2001 Document Revised: 11/30/2011 Document Reviewed: 09/09/2011 [facility] Patient Information 2014 [place], [place].  Upper Respiratory Infection, Adult Alka Seltzer Cold Plus Night and robitussin Dm An upper respiratory infection (URI) is also known as the common cold. It is often caused by a type of germ (virus). Colds are easily spread (contagious). You can pass it to others by kissing, coughing, sneezing, or drinking out of the same glass. Usually, you get better in 1 or 2 weeks.  HOME CARE   Only take medicine as told by your doctor.  Use a warm mist humidifier or breathe in steam from a hot shower.  Drink enough water and fluids to keep your pee (urine) clear or pale yellow.  Get plenty of rest.  Return to work when your temperature is back to  normal or as told by your doctor. You may use a face mask and wash your hands to stop your cold from spreading. GET HELP RIGHT AWAY IF:   After the first few days, you feel you are getting worse.  You have questions about your medicine.  You have chills, shortness of breath, or brown or red spit (mucus).  You have yellow or brown snot (nasal discharge) or pain in the face, especially when you bend forward.  You have a fever, puffy (swollen) neck, pain when you swallow, or white spots in the back of your throat.  You have a bad headache, ear pain, sinus pain, or chest pain.  You have a high-pitched whistling sound when you breathe in and out (wheezing).  You have a lasting cough or cough up blood.  You have sore muscles or a stiff neck. MAKE SURE YOU:   Understand these instructions.  Will watch your condition.  Will get help right away if you are not doing well or get worse. Document Released: 11/17/2007 Document Revised: 08/23/2011 Document Reviewed: 10/05/2010 [facility] Patient Information 2014 [place], [place].

## 2013-08-13 NOTE — ED Provider Notes (Signed)
Medical screening examination/treatment/procedure(s) were performed by resident physician or non-physician practitioner and as supervising physician I was immediately available for consultation/collaboration.   Sherene Plancarte DOUGLAS MD.   Emberlin Verner D Tavi Gaughran, MD 08/13/13 [id number] 

## 2013-08-13 NOTE — ED Notes (Signed)
Patient complain of headache Cough since yesterday

## 2013-08-16 ENCOUNTER — Emergency Department (INDEPENDENT_AMBULATORY_CARE_PROVIDER_SITE_OTHER)
Admission: EM | Admit: 2013-08-16 | Discharge: 2013-08-16 | Disposition: A | Discharge status: Home / Self Care | Payer: PRIVATE HEALTH INSURANCE | Source: Home / Self Care | Attending: Emergency Medicine | Admitting: Emergency Medicine

## 2013-08-16 ENCOUNTER — Encounter (HOSPITAL_COMMUNITY): Payer: Self-pay | Admitting: Emergency Medicine

## 2013-08-16 ENCOUNTER — Emergency Department (INDEPENDENT_AMBULATORY_CARE_PROVIDER_SITE_OTHER): Payer: PRIVATE HEALTH INSURANCE

## 2013-08-16 DIAGNOSIS — J189 Pneumonia, unspecified organism: Secondary | ICD-10-CM

## 2013-08-16 MED ORDER — IPRATROPIUM-ALBUTEROL 0.5-2.5 (3) MG/3ML IN SOLN
RESPIRATORY_TRACT | Status: AC
  Filled 2013-08-16: qty 3

## 2013-08-16 MED ORDER — LIDOCAINE HCL (PF) 1 % IJ SOLN
INTRAMUSCULAR | Status: AC
  Filled 2013-08-16: qty 5

## 2013-08-16 MED ORDER — CEFTRIAXONE SODIUM 1 G IJ SOLR
INTRAMUSCULAR | Status: AC
  Filled 2013-08-16: qty 10

## 2013-08-16 MED ORDER — METHYLPREDNISOLONE ACETATE 80 MG/ML IJ SUSP
80.0000 mg | Freq: Once | INTRAMUSCULAR | Status: AC
  Administered 2013-08-16: 80 mg via INTRAMUSCULAR

## 2013-08-16 MED ORDER — CEFTRIAXONE SODIUM 1 G IJ SOLR
1.0000 g | Freq: Once | INTRAMUSCULAR | Status: AC
  Administered 2013-08-16: 1 g via INTRAMUSCULAR

## 2013-08-16 MED ORDER — AZITHROMYCIN 250 MG PO TABS: ORAL_TABLET | ORAL | Status: DC

## 2013-08-16 MED ORDER — CEFDINIR 300 MG PO CAPS: 300.0000 mg | ORAL_CAPSULE | Freq: Two times a day (BID) | ORAL | Status: DC

## 2013-08-16 MED ORDER — METHYLPREDNISOLONE ACETATE 80 MG/ML IJ SUSP
INTRAMUSCULAR | Status: AC
  Filled 2013-08-16: qty 1

## 2013-08-16 MED ORDER — IPRATROPIUM-ALBUTEROL 0.5-2.5 (3) MG/3ML IN SOLN
3.0000 mL | RESPIRATORY_TRACT | Status: DC
  Administered 2013-08-16: 3 mL via RESPIRATORY_TRACT

## 2013-08-16 NOTE — ED Notes (Signed)
Patient care/med administration delay secondary to department acuity.

## 2013-08-16 NOTE — ED Notes (Signed)
Patient complains of continued headache, sob, cough.  Reports being seen at [facility] and started on prednisone and inhaler.  Reports no improvement.  Has not smoked since Monday.  Patient reports she wants an antibiotic.

## 2013-08-16 NOTE — ED Provider Notes (Signed)
Chief Complaint   Chief Complaint  Patient presents with  . Shortness of Breath    History of Present Illness   Geri SeminoleLisa A Sutton is a 53 year old female who has had a five-day history of cough productive of clear sputum, chest tightness, wheezing, chills, nasal congestion, rhinorrhea with clear drainage, nausea, and dizziness. Symptoms began after she used a Clorox based disinfectant in a closed space. She was here 4 days ago and given albuterol and prednisone she does not feel any better. She is an ex smoker. She stopped smoking this past Monday.  Review of Systems   Other than as noted above, the patient denies any of the following symptoms: Systemic:  No fevers, chills, sweats, or myalgias. Eye:  No redness or discharge. ENT:  No ear pain, headache, nasal congestion, drainage, sinus pressure, or sore throat. Neck:  No neck pain, stiffness, or swollen glands. Lungs:  No cough, sputum production, hemoptysis, wheezing, chest tightness, shortness of breath or chest pain. GI:  No abdominal pain, nausea, vomiting or diarrhea.  PMFSH   Past medical history, family history, social history, meds, and allergies were reviewed.   Physical exam   Vital signs:  BP 179/78  Pulse 80  Temp(Src) 98.6 F (37 C) (Oral)  Resp 22  SpO2 95% General:  Alert and oriented.  In no distress.  Skin warm and dry. Eye:  No conjunctival injection or drainage. Lids were normal. ENT:  TMs and canals were normal, without erythema or inflammation.  Nasal mucosa was clear and uncongested, without drainage.  Mucous membranes were moist.  Pharynx was clear with no exudate or drainage.  There were no oral ulcerations or lesions. Neck:  Supple, no adenopathy, tenderness or mass. Lungs:  No respiratory distress.  Lungs were clear to auscultation, without wheezes, rales or rhonchi.  Breath sounds were clear and equal bilaterally.  Heart:  Regular rhythm, without gallops, murmers or rubs. Skin:  Clear, warm, and dry,  without rash or lesions.   Radiology   Dg Chest 2 View  08/16/2013   CLINICAL DATA:  Four days of dyspnea cough and headache with chills, history of bronchitis and tobacco use  EXAM: CHEST  2 VIEW  COMPARISON:  None.  FINDINGS: The lungs are adequately inflated. The interstitial markings are increased bilaterally in the mid and lower lung zones. There is no pleural effusion. The cardiac silhouette is normal in size. The pulmonary vascularity is not engorged. The mediastinum is normal in width.  IMPRESSION: The findings are consistent with subsegmental atelectasis or early interstitial pneumonia bilaterally. Is no pleural effusion nor evidence of CHF.   Electronically Signed   By: Kristan Brummitt  [place]   On: 08/16/2013 08:59     Course in [facility]   She was given a DuoNeb breathing treatment and Depo-Medrol 80 mg IM. She was also given Rocephin 1 g IM.  Assessment     The encounter diagnosis was Community acquired pneumonia.  Plan    1.  Meds:  The following meds were prescribed:   New Prescriptions   AZITHROMYCIN (ZITHROMAX Z-PAK) 250 MG TABLET    Take as directed.   CEFDINIR (OMNICEF) 300 MG CAPSULE    Take 1 capsule (300 mg total) by mouth 2 (two) times daily.    2.  Patient Education/Counseling:  The patient was given appropriate handouts, self care instructions, and instructed in symptomatic relief.  Instructed to get extra fluids, rest, and use a cool mist vaporizer.    3.  Follow up:  The patient was told to follow up here in 3 days for a recheck, or sooner if becoming worse in any way, and given some red flag symptoms such as increasing fever, difficulty breathing, chest pain, or persistent vomiting which would prompt immediate return.  Follow up here or with her primary care physician in one month for a repeat chest x-ray.      Reuben Likes, MD 08/16/13 1005

## 2013-08-16 NOTE — ED Notes (Signed)
Start cefdinir tomorrow, start zpak today.

## 2013-08-16 NOTE — Discharge Instructions (Signed)
Pneumonia, Adult °Pneumonia is an infection of the lungs.  °CAUSES °Pneumonia may be caused by bacteria or a virus. Usually, these infections are caused by breathing infectious particles into the lungs (respiratory tract). °SYMPTOMS  °· Cough. °· Fever. °· Chest pain. °· Increased rate of breathing. °· Wheezing. °· Mucus production. °DIAGNOSIS  °If you have the common symptoms of pneumonia, your caregiver will typically confirm the diagnosis with a chest X-ray. The X-ray will show an abnormality in the lung (pulmonary infiltrate) if you have pneumonia. Other tests of your blood, urine, or sputum may be done to find the specific cause of your pneumonia. Your caregiver may also do tests (blood gases or pulse oximetry) to see how well your lungs are working. °TREATMENT  °Some forms of pneumonia may be spread to other people when you cough or sneeze. You may be asked to wear a mask before and during your exam. Pneumonia that is caused by bacteria is treated with antibiotic medicine. Pneumonia that is caused by the influenza virus may be treated with an antiviral medicine. Most other viral infections must run their course. These infections will not respond to antibiotics.  °PREVENTION °A pneumococcal shot (vaccine) is available to prevent a common bacterial cause of pneumonia. This is usually suggested for: °· People over 65 years old. °· Patients on chemotherapy. °· People with chronic lung problems, such as bronchitis or emphysema. °· People with immune system problems. °If you are over 65 or have a high risk condition, you may receive the pneumococcal vaccine if you have not received it before. In some countries, a routine influenza vaccine is also recommended. This vaccine can help prevent some cases of pneumonia. You may be offered the influenza vaccine as part of your care. °If you smoke, it is time to quit. You may receive instructions on how to stop smoking. Your caregiver can provide medicines and counseling to  help you quit. °HOME CARE INSTRUCTIONS  °· Cough suppressants may be used if you are losing too much rest. However, coughing protects you by clearing your lungs. You should avoid using cough suppressants if you can. °· Your caregiver may have prescribed medicine if he or she thinks your pneumonia is caused by a bacteria or influenza. Finish your medicine even if you start to feel better. °· Your caregiver may also prescribe an expectorant. This loosens the mucus to be coughed up. °· Only take over-the-counter or prescription medicines for pain, discomfort, or fever as directed by your caregiver. °· Do not smoke. Smoking is a common cause of bronchitis and can contribute to pneumonia. If you are a smoker and continue to smoke, your cough may last several weeks after your pneumonia has cleared. °· A cold steam vaporizer or humidifier in your room or home may help loosen mucus. °· Coughing is often worse at night. Sleeping in a semi-upright position in a recliner or using a couple pillows under your head will help with this. °· Get rest as you feel it is needed. Your body will usually let you know when you need to rest. °SEEK IMMEDIATE MEDICAL CARE IF:  °· Your illness becomes worse. This is especially true if you are elderly or weakened from any other disease. °· You cannot control your cough with suppressants and are losing sleep. °· You begin coughing up blood. °· You develop pain which is getting worse or is uncontrolled with medicines. °· You have a fever. °· Any of the symptoms which initially brought you in for treatment   are getting worse rather than better.  You develop shortness of breath or chest pain. MAKE SURE YOU:   Understand these instructions.  Will watch your condition.  Will get help right away if you are not doing well or get worse. Document Released: 05/31/2005 Document Revised: 08/23/2011 Document Reviewed: 08/20/2010 [facility] Patient Information 2014 [place], [place].   Return here in  3 days for a recheck.  Follow up here or with a primary care doctor in 1 month for a repeat chest x-ray.

## 2013-08-19 ENCOUNTER — Encounter (HOSPITAL_COMMUNITY): Payer: Self-pay | Admitting: Emergency Medicine

## 2013-08-19 ENCOUNTER — Emergency Department (INDEPENDENT_AMBULATORY_CARE_PROVIDER_SITE_OTHER)
Admission: EM | Admit: 2013-08-19 | Discharge: 2013-08-19 | Disposition: A | Discharge status: Home / Self Care | Payer: PRIVATE HEALTH INSURANCE | Source: Home / Self Care | Attending: Emergency Medicine | Admitting: Emergency Medicine

## 2013-08-19 ENCOUNTER — Emergency Department (INDEPENDENT_AMBULATORY_CARE_PROVIDER_SITE_OTHER): Payer: PRIVATE HEALTH INSURANCE

## 2013-08-19 DIAGNOSIS — J189 Pneumonia, unspecified organism: Secondary | ICD-10-CM

## 2013-08-19 MED ORDER — IPRATROPIUM-ALBUTEROL 0.5-2.5 (3) MG/3ML IN SOLN
3.0000 mL | Freq: Once | RESPIRATORY_TRACT | Status: AC
  Administered 2013-08-19: 3 mL via RESPIRATORY_TRACT

## 2013-08-19 MED ORDER — IPRATROPIUM-ALBUTEROL 0.5-2.5 (3) MG/3ML IN SOLN
RESPIRATORY_TRACT | Status: AC
  Filled 2013-08-19: qty 3

## 2013-08-19 MED ORDER — CEFTRIAXONE SODIUM 1 G IJ SOLR: 1.0000 g | Freq: Once | INTRAMUSCULAR | Status: DC

## 2013-08-19 NOTE — ED Provider Notes (Signed)
Medical screening examination/treatment/procedure(s) were performed by non-physician practitioner and as supervising physician I was immediately available for consultation/collaboration.  Ercilia Bettinger, M.D.  Marvelene Stoneberg C Kalima Saylor, MD 08/19/13 2107 

## 2013-08-19 NOTE — ED Provider Notes (Signed)
CSN: [id number]     Arrival date & time 08/19/13  4098 History   First MD Initiated Contact with Patient 08/19/13 1002     Chief Complaint  Patient presents with  . Follow-up   (Consider location/radiation/quality/duration/timing/severity/associated sxs/prior Treatment) Patient is a 53 y.o. female presenting with cough. The history is provided by the patient. No language interpreter was used.  Cough Cough characteristics:  Productive Sputum characteristics:  Yellow Severity:  Moderate Timing:  Constant Progression:  Worsening Chronicity:  New Smoker: yes   Relieved by:  Nothing Worsened by:  Nothing tried Ineffective treatments:  None tried Associated symptoms: shortness of breath   Risk factors: chemical exposure     History reviewed. No pertinent past medical history. Past Surgical History  Procedure Laterality Date  . Cesarean section     No family history on file. History  Substance Use Topics  . Smoking status: Current Every Day Smoker -- 0.50 packs/day    Types: Cigarettes  . Smokeless tobacco: Not on file  . Alcohol Use: No   OB History   Grav Para Term Preterm Abortions TAB SAB Ect Mult Living                 Review of Systems  Respiratory: Positive for cough and shortness of breath.   All other systems reviewed and are negative.    Allergies  Novocain  Home Medications   Current Outpatient Rx  Name  Route  Sig  Dispense  Refill  . albuterol (PROVENTIL HFA;VENTOLIN HFA) 108 (90 BASE) MCG/ACT inhaler   Inhalation   Inhale 2 puffs into the lungs every 4 (four) hours as needed for wheezing or shortness of breath.   1 Inhaler   0   . Aspirin-Salicylamide-Caffeine (BC HEADACHE POWDER PO)   Oral   Take by mouth.         Marland Kitchen azithromycin (ZITHROMAX Z-PAK) 250 MG tablet      Take as directed.   6 tablet   0   . cefdinir (OMNICEF) 300 MG capsule   Oral   Take 1 capsule (300 mg total) by mouth 2 (two) times daily.   20 capsule   0   .  clindamycin (CLEOCIN) 150 MG capsule   Oral   Take 2 capsules (300 mg total) by mouth 3 (three) times daily.   42 capsule   0   . ondansetron (ZOFRAN) 4 MG tablet   Oral   Take 1 tablet (4 mg total) by mouth every 6 (six) hours. Prn n/v   6 tablet   0   . oxyCODONE-acetaminophen (PERCOCET/ROXICET) 5-325 MG per tablet   Oral   Take 1 tablet by mouth every 4 (four) hours as needed for pain.   20 tablet   0   . predniSONE (DELTASONE) 20 MG tablet      3 tabs po x 2 days, 2 tabs q d x 3 d, 1 po x 3 d.   15 tablet   0    BP 182/77  Pulse 87  Temp(Src) 98.7 F (37.1 C) (Oral)  Resp 20  SpO2 93% Physical Exam  Nursing note and vitals reviewed. Constitutional: She is oriented to person, place, and time. She appears well-developed and well-nourished.  HENT:  Head: Normocephalic and atraumatic.  Right Ear: External ear normal.  Left Ear: External ear normal.  Nose: Nose normal.  Eyes: Conjunctivae are normal. Pupils are equal, round, and reactive to light.  Neck: Normal range of motion. Neck  supple.  Cardiovascular: Normal rate and normal heart sounds.   Pulmonary/Chest: Effort normal and breath sounds normal.  Abdominal: Soft. Bowel sounds are normal.  Musculoskeletal: Normal range of motion.  Neurological: She is alert and oriented to person, place, and time. She has normal reflexes.  Skin: Skin is warm.  Psychiatric: She has a normal mood and affect.    ED Course  Procedures (including critical care time) Labs Review Labs Reviewed - No data to display Imaging Review Dg Chest 2 View  08/19/2013   CLINICAL DATA:  Shortness of breath, smoker.  EXAM: CHEST  2 VIEW  COMPARISON:  DG CHEST 2 VIEW dated 08/16/2013  FINDINGS: There is hyperinflation of the lungs compatible with COPD. Continued opacity in the lingula which may be slightly increased since prior study. Findings concerning for pneumonia/bronchopneumonia. No definite confluent opacity on the right. No effusions. No  acute bony abnormality.  IMPRESSION: Slight increased lingular opacity concerning for pneumonia/ bronchopneumonia.  COPD.   Electronically Signed   By: Charlett NoseKevin  Dover M.D.   On: 08/19/2013 10:20     MDM   1. Community acquired pneumonia        Elson AreasLeslie K Sofia, [place] JerseyPA-C 08/19/13 1131

## 2013-08-19 NOTE — ED Notes (Signed)
Recheck of pneumonia diagnosis.  Looking better to this nurse.  Patient admits to feeling better, but not well.  Thinks she may need another day to get strength back.  Reports still feeling very drained, just no strength

## 2013-08-19 NOTE — Discharge Instructions (Signed)

## 2013-08-22 ENCOUNTER — Encounter (HOSPITAL_COMMUNITY): Payer: Self-pay | Admitting: Emergency Medicine

## 2013-08-22 ENCOUNTER — Emergency Department (INDEPENDENT_AMBULATORY_CARE_PROVIDER_SITE_OTHER)
Admission: EM | Admit: 2013-08-22 | Discharge: 2013-08-22 | Disposition: A | Discharge status: Home / Self Care | Payer: PRIVATE HEALTH INSURANCE | Source: Home / Self Care | Attending: Family Medicine | Admitting: Family Medicine

## 2013-08-22 DIAGNOSIS — J189 Pneumonia, unspecified organism: Secondary | ICD-10-CM

## 2013-08-22 HISTORY — DX: Pneumonia, unspecified organism: J18.9

## 2013-08-22 MED ORDER — PREDNISONE 50 MG PO TABS: 50.0000 mg | ORAL_TABLET | Freq: Every day | ORAL | Status: DC

## 2013-08-22 NOTE — ED Provider Notes (Signed)
Mariah Sutton is a 53 y.o. female who presents to Urgent Care today for followup pneumonia. Patient was seen March 8 and diagnosed with pneumonia in the setting of COPD exacerbation/bronchitis. She was given Omnicef and azithromycin. She feels somewhat better today. She continues to have a cough and wheezing. She has finished her prednisone a few days ago. She continues to use albuterol. She is here today with a short-term disability form from work. She does not feel ready to return to work. Her job requires pushing lifting and pulling.  Past Medical History  Diagnosis Date  . Pneumonia    History  Substance Use Topics  . Smoking status: Current Every Day Smoker -- 0.50 packs/day    Types: Cigarettes  . Smokeless tobacco: Not on file  . Alcohol Use: No   ROS as above Medications: No current facility-administered medications for this encounter.   Current Outpatient Prescriptions  Medication Sig Dispense Refill  . albuterol (PROVENTIL HFA;VENTOLIN HFA) 108 (90 BASE) MCG/ACT inhaler Inhale 2 puffs into the lungs every 4 (four) hours as needed for wheezing or shortness of breath.  1 Inhaler  0  . Aspirin-Salicylamide-Caffeine (BC HEADACHE POWDER PO) Take by mouth.      Marland Kitchen. azithromycin (ZITHROMAX Z-PAK) 250 MG tablet Take as directed.  6 tablet  0  . cefdinir (OMNICEF) 300 MG capsule Take 1 capsule (300 mg total) by mouth 2 (two) times daily.  20 capsule  0  . clindamycin (CLEOCIN) 150 MG capsule Take 2 capsules (300 mg total) by mouth 3 (three) times daily.  42 capsule  0  . ondansetron (ZOFRAN) 4 MG tablet Take 1 tablet (4 mg total) by mouth every 6 (six) hours. Prn n/v  6 tablet  0  . oxyCODONE-acetaminophen (PERCOCET/ROXICET) 5-325 MG per tablet Take 1 tablet by mouth every 4 (four) hours as needed for pain.  20 tablet  0  . predniSONE (DELTASONE) 20 MG tablet 3 tabs po x 2 days, 2 tabs q d x 3 d, 1 po x 3 d.  15 tablet  0  . predniSONE (DELTASONE) 50 MG tablet Take 1 tablet (50 mg total) by  mouth daily.  5 tablet  0    Exam:  BP 176/78  Pulse 78  Temp(Src) 98.6 F (37 C) (Oral)  Resp 17  SpO2 94% Gen: Well NAD HEENT: EOMI,  MMM Lungs: Normal work of breathing.  Coarse breath sounds bilaterally with wheezing  Heart: RRR no MRG Abd: NABS, Soft. NT, ND Exts: Brisk capillary refill, warm and well perfused.    Assessment and Plan: 53 y.o. female with  community-acquired pneumonia in the setting of COPD exacerbation. I have extended the prednisone course. Continue antibiotics and albuterol. Excuse from work until Monday, March 16.  Followup as needed  Discussed warning signs or symptoms. Please see discharge instructions. Patient expresses understanding.    Rodolph BongEvan S Malaney Mcbean, MD 08/22/13 [phone]

## 2013-08-22 NOTE — ED Notes (Signed)
Follow up of pneumonia

## 2013-08-22 NOTE — Discharge Instructions (Signed)
Thank you for coming in today. Take prednisone daily for 5 days Return to work on Monday Call or go to the emergency room if you get worse, have trouble breathing, have chest pains, or palpitations.   Pneumonia, Adult Pneumonia is an infection of the lungs.  CAUSES Pneumonia may be caused by bacteria or a virus. Usually, these infections are caused by breathing infectious particles into the lungs (respiratory tract). SYMPTOMS   Cough.  Fever.  Chest pain.  Increased rate of breathing.  Wheezing.  Mucus production. DIAGNOSIS  If you have the common symptoms of pneumonia, your caregiver will typically confirm the diagnosis with a chest X-ray. The X-ray will show an abnormality in the lung (pulmonary infiltrate) if you have pneumonia. Other tests of your blood, urine, or sputum may be done to find the specific cause of your pneumonia. Your caregiver may also do tests (blood gases or pulse oximetry) to see how well your lungs are working. TREATMENT  Some forms of pneumonia may be spread to other people when you cough or sneeze. You may be asked to wear a mask before and during your exam. Pneumonia that is caused by bacteria is treated with antibiotic medicine. Pneumonia that is caused by the influenza virus may be treated with an antiviral medicine. Most other viral infections must run their course. These infections will not respond to antibiotics.  PREVENTION A pneumococcal shot (vaccine) is available to prevent a common bacterial cause of pneumonia. This is usually suggested for:  People over 53 years old.  Patients on chemotherapy.  People with chronic lung problems, such as bronchitis or emphysema.  People with immune system problems. If you are over 65 or have a high risk condition, you may receive the pneumococcal vaccine if you have not received it before. In some countries, a routine influenza vaccine is also recommended. This vaccine can help prevent some cases of  pneumonia.You may be offered the influenza vaccine as part of your care. If you smoke, it is time to quit. You may receive instructions on how to stop smoking. Your caregiver can provide medicines and counseling to help you quit. HOME CARE INSTRUCTIONS   Cough suppressants may be used if you are losing too much rest. However, coughing protects you by clearing your lungs. You should avoid using cough suppressants if you can.  Your caregiver may have prescribed medicine if he or she thinks your pneumonia is caused by a bacteria or influenza. Finish your medicine even if you start to feel better.  Your caregiver may also prescribe an expectorant. This loosens the mucus to be coughed up.  Only take over-the-counter or prescription medicines for pain, discomfort, or fever as directed by your caregiver.  Do not smoke. Smoking is a common cause of bronchitis and can contribute to pneumonia. If you are a smoker and continue to smoke, your cough may last several weeks after your pneumonia has cleared.  A cold steam vaporizer or humidifier in your room or home may help loosen mucus.  Coughing is often worse at night. Sleeping in a semi-upright position in a recliner or using a couple pillows under your head will help with this.  Get rest as you feel it is needed. Your body will usually let you know when you need to rest. SEEK IMMEDIATE MEDICAL CARE IF:   Your illness becomes worse. This is especially true if you are elderly or weakened from any other disease.  You cannot control your cough with suppressants and are  losing sleep.  You begin coughing up blood.  You develop pain which is getting worse or is uncontrolled with medicines.  You have a fever.  Any of the symptoms which initially brought you in for treatment are getting worse rather than better.  You develop shortness of breath or chest pain. MAKE SURE YOU:   Understand these instructions.  Will watch your condition.  Will get  help right away if you are not doing well or get worse. Document Released: 05/31/2005 Document Revised: 08/23/2011 Document Reviewed: 08/20/2010 [facility] Patient Information 2014 [place], [place].

## 2014-04-25 DIAGNOSIS — E7800 Pure hypercholesterolemia, unspecified: Secondary | ICD-10-CM | POA: Insufficient documentation

## 2014-04-25 DIAGNOSIS — R7303 Prediabetes: Secondary | ICD-10-CM | POA: Insufficient documentation

## 2014-04-25 DIAGNOSIS — E1169 Type 2 diabetes mellitus with other specified complication: Secondary | ICD-10-CM | POA: Insufficient documentation

## 2014-04-25 DIAGNOSIS — E669 Obesity, unspecified: Secondary | ICD-10-CM | POA: Insufficient documentation

## 2014-04-25 DIAGNOSIS — E782 Mixed hyperlipidemia: Secondary | ICD-10-CM | POA: Insufficient documentation

## 2015-01-03 ENCOUNTER — Other Ambulatory Visit: Payer: Self-pay | Admitting: Family Medicine

## 2015-01-03 DIAGNOSIS — Z1231 Encounter for screening mammogram for malignant neoplasm of breast: Secondary | ICD-10-CM

## 2015-01-07 ENCOUNTER — Ambulatory Visit
Admission: RE | Admit: 2015-01-07 | Discharge: 2015-01-07 | Disposition: A | Discharge status: Home / Self Care | Payer: PRIVATE HEALTH INSURANCE | Source: Ambulatory Visit | Attending: Family Medicine | Admitting: Family Medicine

## 2015-01-07 DIAGNOSIS — Z1231 Encounter for screening mammogram for malignant neoplasm of breast: Secondary | ICD-10-CM | POA: Diagnosis not present

## 2015-03-13 ENCOUNTER — Other Ambulatory Visit: Payer: Self-pay

## 2015-03-13 ENCOUNTER — Other Ambulatory Visit: Payer: Self-pay | Admitting: Family Medicine

## 2015-03-14 LAB — CMP12+LP+TP+TSH+6AC+CBC/D/PLT
ALK PHOS: 67 IU/L (ref 39–117)
ALT: 27 IU/L (ref 0–32)
AST: 23 IU/L (ref 0–40)
Albumin/Globulin Ratio: 1.4 (ref 1.1–2.5)
Albumin: 4.2 g/dL (ref 3.5–5.5)
BASOS: 1 %
BUN/Creatinine Ratio: 27 — ABNORMAL HIGH (ref 9–23)
BUN: 27 mg/dL — AB (ref 6–24)
Basophils Absolute: 0 10*3/uL (ref 0.0–0.2)
Bilirubin Total: 0.3 mg/dL (ref 0.0–1.2)
CALCIUM: 9 mg/dL (ref 8.7–10.2)
CHLORIDE: 100 mmol/L (ref 97–108)
CREATININE: 0.99 mg/dL (ref 0.57–1.00)
Chol/HDL Ratio: 3.4 ratio units (ref 0.0–4.4)
Cholesterol, Total: 183 mg/dL (ref 100–199)
EOS (ABSOLUTE): 0.2 10*3/uL (ref 0.0–0.4)
Eos: 3 %
Estimated CHD Risk: 0.5 times avg. (ref 0.0–1.0)
Free Thyroxine Index: 1.2 (ref 1.2–4.9)
GFR, EST AFRICAN AMERICAN: 75 mL/min/{1.73_m2} (ref >59)
GFR, EST NON AFRICAN AMERICAN: 65 mL/min/{1.73_m2} (ref >59)
GGT: 15 IU/L (ref 0–60)
Globulin, Total: 2.9 g/dL (ref 1.5–4.5)
Glucose: 88 mg/dL (ref 65–99)
HDL: 54 mg/dL (ref >39)
HEMATOCRIT: 40.5 % (ref 34.0–46.6)
Hemoglobin: 13.2 g/dL (ref 11.1–15.9)
Immature Grans (Abs): 0 10*3/uL (ref 0.0–0.1)
Immature Granulocytes: 0 %
Iron: 77 ug/dL (ref 27–159)
LDH: 223 IU/L (ref 119–226)
LDL CALC: 107 mg/dL — AB (ref 0–99)
LYMPHS: 25 %
Lymphocytes Absolute: 1.8 10*3/uL (ref 0.7–3.1)
MCH: 29.5 pg (ref 26.6–33.0)
MCHC: 32.6 g/dL (ref 31.5–35.7)
MCV: 91 fL (ref 79–97)
Monocytes Absolute: 0.5 10*3/uL (ref 0.1–0.9)
Monocytes: 7 %
Neutrophils Absolute: 4.8 10*3/uL (ref 1.4–7.0)
Neutrophils: 64 %
PHOSPHORUS: 3.7 mg/dL (ref 2.5–4.5)
POTASSIUM: 4.9 mmol/L (ref 3.5–5.2)
Platelets: 220 10*3/uL (ref 150–379)
RBC: 4.47 x10E6/uL (ref 3.77–5.28)
RDW: 13.4 % (ref 12.3–15.4)
Sodium: 138 mmol/L (ref 134–144)
T3 Uptake Ratio: 22 % — ABNORMAL LOW (ref 24–39)
T4 TOTAL: 5.4 ug/dL (ref 4.5–12.0)
TSH: 4.77 u[IU]/mL — AB (ref 0.450–4.500)
Total Protein: 7.1 g/dL (ref 6.0–8.5)
Triglycerides: 109 mg/dL (ref 0–149)
URIC ACID: 2.6 mg/dL (ref 2.5–7.1)
VLDL Cholesterol Cal: 22 mg/dL (ref 5–40)
WBC: 7.3 10*3/uL (ref 3.4–10.8)

## 2015-03-14 LAB — HGB A1C W/O EAG: HEMOGLOBIN A1C: 6 % — AB (ref 4.8–5.6)

## 2015-03-18 ENCOUNTER — Other Ambulatory Visit: Payer: Self-pay

## 2015-09-02 ENCOUNTER — Emergency Department: Payer: No Typology Code available for payment source

## 2015-09-02 ENCOUNTER — Emergency Department
Admission: EM | Admit: 2015-09-02 | Discharge: 2015-09-02 | Disposition: A | Discharge status: Home / Self Care | Payer: No Typology Code available for payment source | Attending: Emergency Medicine | Admitting: Emergency Medicine

## 2015-09-02 ENCOUNTER — Encounter: Payer: Self-pay | Admitting: Medical Oncology

## 2015-09-02 DIAGNOSIS — Z7982 Long term (current) use of aspirin: Secondary | ICD-10-CM | POA: Insufficient documentation

## 2015-09-02 DIAGNOSIS — J069 Acute upper respiratory infection, unspecified: Secondary | ICD-10-CM

## 2015-09-02 DIAGNOSIS — E7800 Pure hypercholesterolemia, unspecified: Secondary | ICD-10-CM | POA: Insufficient documentation

## 2015-09-02 DIAGNOSIS — Z87891 Personal history of nicotine dependence: Secondary | ICD-10-CM | POA: Insufficient documentation

## 2015-09-02 DIAGNOSIS — Z888 Allergy status to other drugs, medicaments and biological substances status: Secondary | ICD-10-CM | POA: Insufficient documentation

## 2015-09-02 DIAGNOSIS — Z8701 Personal history of pneumonia (recurrent): Secondary | ICD-10-CM | POA: Insufficient documentation

## 2015-09-02 HISTORY — DX: Pure hypercholesterolemia, unspecified: E78.00

## 2015-09-02 LAB — BASIC METABOLIC PANEL
ANION GAP: 8 (ref 5–15)
BUN: 17 mg/dL (ref 6–20)
CALCIUM: 8.8 mg/dL — AB (ref 8.9–10.3)
CO2: 21 mmol/L — AB (ref 22–32)
Chloride: 108 mmol/L (ref 101–111)
Creatinine, Ser: 0.98 mg/dL (ref 0.44–1.00)
GFR calc Af Amer: >60 mL/min (ref >60)
GFR calc non Af Amer: >60 mL/min (ref >60)
GLUCOSE: 118 mg/dL — AB (ref 65–99)
Potassium: 3.7 mmol/L (ref 3.5–5.1)
Sodium: 137 mmol/L (ref 135–145)

## 2015-09-02 LAB — CBC
HCT: 40.1 % (ref 35.0–47.0)
HEMOGLOBIN: 13.3 g/dL (ref 12.0–16.0)
MCH: 29.7 pg (ref 26.0–34.0)
MCHC: 33.1 g/dL (ref 32.0–36.0)
MCV: 89.6 fL (ref 80.0–100.0)
Platelets: 190 10*3/uL (ref 150–440)
RBC: 4.48 MIL/uL (ref 3.80–5.20)
RDW: 13.8 % (ref 11.5–14.5)
WBC: 5.7 10*3/uL (ref 3.6–11.0)

## 2015-09-02 MED ORDER — PREDNISONE 10 MG PO TABS: ORAL_TABLET | ORAL | Status: DC

## 2015-09-02 MED ORDER — ALBUTEROL SULFATE HFA 108 (90 BASE) MCG/ACT IN AERS: 2.0000 | INHALATION_SPRAY | Freq: Four times a day (QID) | RESPIRATORY_TRACT | Status: DC | PRN

## 2015-09-02 MED ORDER — GUAIFENESIN-CODEINE 100-10 MG/5ML PO SOLN: 5.0000 mL | ORAL | Status: DC | PRN

## 2015-09-02 MED ORDER — IPRATROPIUM-ALBUTEROL 0.5-2.5 (3) MG/3ML IN SOLN
3.0000 mL | Freq: Once | RESPIRATORY_TRACT | Status: AC
  Administered 2015-09-02: 3 mL via RESPIRATORY_TRACT
  Filled 2015-09-02: qty 3

## 2015-09-02 NOTE — ED Provider Notes (Signed)
EKG interpreted by me  Date: 09/02/2015  Rate: 72  Rhythm: normal sinus rhythm  QRS Axis: normal  Intervals: normal  ST/T Wave abnormalities: normal  Conduction Disutrbances: none  Narrative Interpretation: unremarkable      Sharman CheekPhillip Hanh Kertesz, MD 09/02/15 1526

## 2015-09-02 NOTE — ED Provider Notes (Signed)
[facility] Emergency Department Provider Note  ____________________________________________  Time seen: Approximately 2:31 PM  I have reviewed the triage vital signs and the nursing notes.   HISTORY  Chief Complaint Cough; Headache; and Generalized Body Aches   HPI Mariah Sutton is a 55 y.o. female is here with complaint of cough, congestion, headache and body aches for 3 days with her feeling worse beginning 1 day ago.She reports she has had some dizziness during the last couple of days. She is not take any over-the-counter medication for her symptoms. She is unaware of any fever or chills. She denies any nausea or vomiting. She states she does have a and passed a history of bronchitis and pneumonia. Patient continues to smoke daily. Patient currently rates her pain as 10 over 10.   Past Medical History  Diagnosis Date  . Pneumonia   . High cholesterol     There are no active problems to display for this patient.   Past Surgical History  Procedure Laterality Date  . Cesarean section    . Breast cyst aspiration Bilateral     Current Outpatient Rx  Name  Route  Sig  Dispense  Refill  . albuterol (PROVENTIL HFA;VENTOLIN HFA) 108 (90 Base) MCG/ACT inhaler   Inhalation   Inhale 2 puffs into the lungs every 6 (six) hours as needed for wheezing or shortness of breath.   1 Inhaler   2   . Aspirin-Salicylamide-Caffeine (BC HEADACHE POWDER PO)   Oral   Take by mouth.         Marland Kitchen guaiFENesin-codeine 100-10 MG/5ML syrup   Oral   Take 5 mLs by mouth every 4 (four) hours as needed.   120 mL   0   . predniSONE (DELTASONE) 10 MG tablet      Take 3 tablets once a day for 3 days   9 tablet   0     Allergies Novocain  Family History  Problem Relation Age of Onset  . Pancreatic cancer Father 62  . Prostate cancer Maternal Grandfather 85  . Lung cancer Paternal Grandfather 32    Social History Social History  Substance Use Topics  . Smoking  status: Former Smoker -- 0.50 packs/day    Types: Cigarettes  . Smokeless tobacco: None  . Alcohol Use: No    Review of Systems Constitutional: No fever/chills ENT: No sore throat. Cardiovascular: Denies chest pain. Respiratory: Denies shortness of breath.Positive cough Gastrointestinal:  No nausea, no vomiting.  No diarrhea.  Musculoskeletal: Negative for back pain. Skin: Negative for rash. Neurological: Negative for headaches, focal weakness or numbness.  10-point ROS otherwise negative.  ____________________________________________   PHYSICAL EXAM:  VITAL SIGNS: ED Triage Vitals  Enc Vitals Group     BP 09/02/15 1118 93/58 mmHg     Pulse Rate 09/02/15 1118 71     Resp 09/02/15 1118 18     Temp 09/02/15 1118 98.5 F (36.9 C)     Temp Source 09/02/15 1118 Oral     SpO2 09/02/15 1118 93 %     Weight 09/02/15 1118 182 lb (82.555 kg)     Height 09/02/15 1118 5' (1.524 m)     Head Cir --      Peak Flow --      Pain Score 09/02/15 1118 10     Pain Loc --      Pain Edu? --      Excl. in GC? --     Constitutional: Alert  and oriented. Well appearing and in no acute distress. Eyes: Conjunctivae are normal. PERRL. EOMI. Head: Atraumatic. Nose: Mild congestion/rhinnorhea. EACs and TMs are clear bilaterally. Mouth/Throat: Mucous membranes are moist.  Oropharynx non-erythematous. Also posterior drainage. Neck: No stridor. Supple. Hematological/Lymphatic/Immunilogical: No cervical lymphadenopathy. Cardiovascular: Normal rate, regular rhythm. Grossly normal heart sounds.  Good peripheral circulation. Respiratory: Normal respiratory effort.  No retractions. Lungs there is a mild expiratory wheeze heard bilaterally. Patient does not appear to be any respiratory distress and is able to speak in complete sentences. Patient has very coarse cough. Gastrointestinal: Soft and nontender. No distention.  Musculoskeletal: Moves upper and lower extremities without any difficulty. Normal  gait was noted Neurologic:  Normal speech and language. No gross focal neurologic deficits are appreciated. No gait instability. Skin:  Skin is warm, dry and intact. No rash noted. Psychiatric: Mood and affect are normal. Speech and behavior are normal.  ____________________________________________   LABS (all labs ordered are listed, but only abnormal results are displayed)  Labs Reviewed  BASIC METABOLIC PANEL - Abnormal; Notable for the following:    CO2 21 (*)    Glucose, Bld 118 (*)    Calcium 8.8 (*)    All other components within normal limits  CBC  URINALYSIS COMPLETEWITH MICROSCOPIC ([facility] ONLY)  CBG MONITORING, ED   ____________________________________________  EKG  Per Dr. Scotty CourtStafford ____________________________________________  RADIOLOGY  Chest x-ray per radiologist is negative for acute disease. ____________________________________________   PROCEDURES  Procedure(s) performed: None  Critical Care performed: No  ____________________________________________   INITIAL IMPRESSION / ASSESSMENT AND PLAN / ED COURSE  Pertinent labs & imaging results that were available during my care of the patient were reviewed by me and considered in my medical decision making (see chart for details).  Patient was given a prescription for albuterol inhaler, Robitussin-AC as needed for cough and prednisone 30 mg for 3 days. Patient is follow[facility] if any continued problems. ____________________________________________   FINAL CLINICAL IMPRESSION(S) / ED DIAGNOSES  Final diagnoses:  Viral upper respiratory illness      Tommi RumpsRhonda L Summers, PA-C 09/02/15 1530  Sharman CheekPhillip Stafford, MD 09/02/15 1544

## 2015-09-02 NOTE — ED Notes (Addendum)
Pt reports since yesterday she has been having cough, headache and body aches. Denies vomiting. Pt also reports dizziness.

## 2015-09-02 NOTE — Discharge Instructions (Signed)
Upper Respiratory Infection, Adult Most upper respiratory infections (URIs) are caused by a virus. A URI affects the nose, throat, and upper air passages. The most common type of URI is often called "the common cold." HOME CARE   Take medicines only as told by your doctor.  Gargle warm saltwater or take cough drops to comfort your throat as told by your doctor.  Use a warm mist humidifier or inhale steam from a shower to increase air moisture. This may make it easier to breathe.  Drink enough fluid to keep your pee (urine) clear or pale yellow.  Eat soups and other clear broths.  Have a healthy diet.  Rest as needed.  Go back to work when your fever is gone or your doctor says it is okay.  You may need to stay home longer to avoid giving your URI to others.  You can also wear a face mask and wash your hands often to prevent spread of the virus.  Use your inhaler more if you have asthma.  Do not use any tobacco products, including cigarettes, chewing tobacco, or electronic cigarettes. If you need help quitting, ask your doctor. GET HELP IF:  You are getting worse, not better.  Your symptoms are not helped by medicine.  You have chills.  You are getting more short of breath.  You have brown or red mucus.  You have yellow or brown discharge from your nose.  You have pain in your face, especially when you bend forward.  You have a fever.  You have puffy (swollen) neck glands.  You have pain while swallowing.  You have white areas in the back of your throat. GET HELP RIGHT AWAY IF:   You have very bad or constant:  Headache.  Ear pain.  Pain in your forehead, behind your eyes, and over your cheekbones (sinus pain).  Chest pain.  You have long-lasting (chronic) lung disease and any of the following:  Wheezing.  Long-lasting cough.  Coughing up blood.  A change in your usual mucus.  You have a stiff neck.  You have changes in  your:  Vision.  Hearing.  Thinking.  Mood. MAKE SURE YOU:   Understand these instructions.  Will watch your condition.  Will get help right away if you are not doing well or get worse.   This information is not intended to replace advice given to you by your health care provider. Make sure you discuss any questions you have with your health care provider.   Document Released: 11/17/2007 Document Revised: 10/15/2014 Document Reviewed: 09/05/2013 Elsevier Interactive Patient Education 2016 [employer]    Follow-up with your doctor or [facility] if any continued problems. Begin taking Robitussin as needed for cough. The aware that this medication has narcotic and may cause drowsiness. Also prednisone 3 tablets once a day for 3 days to help with her breathing and had albuterol inhaler for cough and for wheezing. You may also take Tylenol as needed for body aches. Drink plenty of fluids.

## 2015-12-02 ENCOUNTER — Other Ambulatory Visit: Payer: Self-pay | Admitting: Orthopedic Surgery

## 2015-12-02 DIAGNOSIS — M25562 Pain in left knee: Secondary | ICD-10-CM

## 2015-12-02 DIAGNOSIS — M1712 Unilateral primary osteoarthritis, left knee: Secondary | ICD-10-CM

## 2015-12-24 ENCOUNTER — Ambulatory Visit
Admission: RE | Admit: 2015-12-24 | Discharge: 2015-12-24 | Disposition: A | Discharge status: Home / Self Care | Payer: No Typology Code available for payment source | Source: Ambulatory Visit | Attending: Orthopedic Surgery | Admitting: Orthopedic Surgery

## 2015-12-24 DIAGNOSIS — M25562 Pain in left knee: Secondary | ICD-10-CM | POA: Diagnosis present

## 2015-12-24 DIAGNOSIS — M1712 Unilateral primary osteoarthritis, left knee: Secondary | ICD-10-CM | POA: Diagnosis present

## 2015-12-24 DIAGNOSIS — X58XXXA Exposure to other specified factors, initial encounter: Secondary | ICD-10-CM | POA: Diagnosis not present

## 2015-12-24 DIAGNOSIS — S83242A Other tear of medial meniscus, current injury, left knee, initial encounter: Secondary | ICD-10-CM | POA: Diagnosis not present

## 2015-12-24 DIAGNOSIS — M94262 Chondromalacia, left knee: Secondary | ICD-10-CM | POA: Insufficient documentation

## 2016-05-18 ENCOUNTER — Encounter: Attending: Obstetrics & Gynecology | Primary: Internal Medicine

## 2016-07-22 ENCOUNTER — Ambulatory Visit
Admit: 2016-07-22 | Discharge: 2016-07-22 | Payer: PRIVATE HEALTH INSURANCE | Attending: Obstetrics & Gynecology | Primary: Internal Medicine

## 2016-07-22 ENCOUNTER — Encounter

## 2016-07-22 DIAGNOSIS — Z01419 Encounter for gynecological examination (general) (routine) without abnormal findings: Secondary | ICD-10-CM

## 2016-07-23 LAB — CA 125: CA 125: 5.8 U/mL (ref 0.0–35.0)

## 2016-07-25 NOTE — Progress Notes (Signed)
Subjective:      Patient ID: Linda Jacobs is a 56 y.o. female.    Patient is here for annual. Patient with some pelvic cramping. Patient with fam hx breast and ovarian ca.         Review of Systems   Constitutional: Negative.    HENT: Negative.    Eyes: Negative.    Respiratory: Negative.    Cardiovascular: Negative.    Gastrointestinal: Negative.    Genitourinary: Negative.    Musculoskeletal: Negative.    Skin: Negative.    Neurological: Negative.    Psychiatric/Behavioral: Negative.      Date of Birth [birth year]  Past Medical History:   Diagnosis Date   ??? Diabetes mellitus (HCC)    ??? High cholesterol      Past Surgical History:   Procedure Laterality Date   ??? LAPAROSCOPY      infertility     OB History   Gravida Para Term Preterm AB Living   1 1 1          SAB TAB Ectopic Molar Multiple Live Births             1      # Outcome Date GA Lbr Len/2nd Weight Sex Delivery Anes PTL Lv   1 Term 11/02/01    F [id number]           Social History     Social History   ??? Marital status: Married     Spouse name: N/A   ??? Number of children: N/A   ??? Years of education: N/A     Occupational History   ??? Not on file.     Social History Main Topics   ??? Smoking status: Never Smoker   ??? Smokeless tobacco: Never Used   ??? Alcohol use Yes      Comment: occ.   ??? Drug use: No   ??? Sexual activity: No     Other Topics Concern   ??? Not on file     Social History Narrative   ??? No narrative on file     Allergies   Allergen Reactions   ??? Latex Rash     blisters   ??? Pcn [Penicillins] Hives     No outpatient prescriptions have been marked as taking for the 07/22/16 encounter (Office Visit) with Estill Batten, MD.     No family history on file.  BP (!) 155/65 (Site: Right Arm, Position: Sitting, Cuff Size: Large Adult)    Pulse 82    Temp 97.2 ??F (36.2 ??C) (Oral)    Resp 17    Ht 5' 9.5" (1.765 m)    Wt 276 lb 12.8 oz (125.6 kg)    LMP  (LMP Unknown)    Breastfeeding? No    BMI 40.29 kg/m??       Objective:   Physical Exam   Constitutional: She is  oriented to person, place, and time. She appears well-developed and well-nourished. No distress.   HENT:   Head: Normocephalic and atraumatic.   Eyes: EOM are normal. Pupils are equal, round, and reactive to light.   Neck: Normal range of motion. Neck supple. No thyromegaly present.   Cardiovascular: Normal rate, regular rhythm and normal heart sounds.  Exam reveals no gallop and no friction rub.    No murmur heard.  Pulmonary/Chest: Effort normal and breath sounds normal. No respiratory distress. She has no wheezes. She has no rales.   Abdominal: Soft. Bowel sounds are  normal. She exhibits no distension and no mass. There is no hepatomegaly. There is no tenderness. There is no rebound and no guarding. No hernia.   Genitourinary: Rectum normal, vagina normal and uterus normal. Rectal exam shows no external hemorrhoid, no internal hemorrhoid, no fissure, no mass, no tenderness and guaiac negative stool. No breast swelling, tenderness, discharge or bleeding. There is no rash, tenderness, lesion or injury on the right labia. There is no rash, tenderness, lesion or injury on the left labia. Uterus is not deviated, not enlarged, not fixed and not tender. Cervix exhibits no motion tenderness, no discharge and no friability. Right adnexum displays no mass, no tenderness and no fullness. Left adnexum displays no mass, no tenderness and no fullness. No erythema, tenderness or bleeding in the vagina. No foreign body in the vagina. No signs of injury around the vagina. No vaginal discharge found.   Genitourinary Comments: Normal urethral meatus, nl urethra, nl bladder.     Musculoskeletal: Normal range of motion. She exhibits no edema or tenderness.   Lymphadenopathy:     She has no cervical adenopathy.        Right: No inguinal adenopathy present.        Left: No inguinal adenopathy present.   Neurological: She is alert and oriented to person, place, and time. She has normal reflexes.   Skin: Skin is warm and dry. No rash  noted. She is not diaphoretic. No erythema.   Psychiatric: She has a normal mood and affect. Her behavior is normal. Judgment and thought content normal.       Assessment:      1. Annual  2. Menopause  3. Pelvic cramping  4. Family hx Ovarian cancer  5. Family hx Breast cancer      Plan:      1. Pap, calcium, exercise, mammogram, hemocult negative  2. Stable  3-5. For pelvic US, CA 125

## 2016-07-26 LAB — HUMAN PAPILLOMAVIRUS (HPV) DNA PROBE THIN PREP HIGH RISK
HPV Genotype 16: NOT DETECTED
HPV Type 18: NOT DETECTED
HPVOH (OTHER TYPES): NOT DETECTED

## 2016-12-03 ENCOUNTER — Ambulatory Visit: Payer: Self-pay | Admitting: *Deleted

## 2016-12-03 DIAGNOSIS — Z0000 Encounter for general adult medical examination without abnormal findings: Secondary | ICD-10-CM

## 2016-12-03 NOTE — Progress Notes (Signed)
[employer] insurance premium discount evaluation: Had labs done at PCP [facility]. All values pulled from that OV. Pt following with them for elevated A1c and LDL twice a year.  BP 124/84 LDL 109 A1c 6.2 Ht 4'11" Wt 185# BMI 37.28 Pt had no questions regarding labs as she has reviewed with pcp.  [employer] ROI form signed. Tobacco Free Attestation form signed.  Forms placed in paper chart.

## 2017-05-03 ENCOUNTER — Encounter

## 2017-05-03 ENCOUNTER — Inpatient Hospital Stay: Admit: 2017-05-03 | Payer: PRIVATE HEALTH INSURANCE | Primary: Internal Medicine

## 2017-05-03 DIAGNOSIS — Z1231 Encounter for screening mammogram for malignant neoplasm of breast: Secondary | ICD-10-CM

## 2017-05-03 LAB — LIPID PANEL
Cholesterol, Total: 142 mg/dL (ref 0–199)
HDL: 45 mg/dL (ref 40–60)
LDL Calculated: 68 mg/dL (ref <100)
Triglycerides: 143 mg/dL (ref 0–150)
VLDL Cholesterol Calculated: 29 mg/dL

## 2017-05-03 LAB — COMPREHENSIVE METABOLIC PANEL
ALT: 20 U/L (ref 10–40)
AST: 21 U/L (ref 15–37)
Albumin/Globulin Ratio: 1.8 (ref 1.1–2.2)
Albumin: 4.5 g/dL (ref 3.4–5.0)
Alkaline Phosphatase: 104 U/L (ref 40–129)
Anion Gap: 14 (ref 3–16)
BUN: 16 mg/dL (ref 7–20)
CO2: 23 mmol/L (ref 21–32)
Calcium: 9.5 mg/dL (ref 8.3–10.6)
Chloride: 104 mmol/L (ref 99–110)
Creatinine: 0.6 mg/dL (ref 0.6–1.1)
GFR African American: >60 (ref >60)
GFR Non-African American: >60 (ref >60)
Globulin: 2.5 g/dL
Glucose: 150 mg/dL — ABNORMAL HIGH (ref 70–99)
Potassium: 4.3 mmol/L (ref 3.5–5.1)
Sodium: 141 mmol/L (ref 136–145)
Total Bilirubin: <0.2 mg/dL (ref 0.0–1.0)
Total Protein: 7 g/dL (ref 6.4–8.2)

## 2017-05-04 LAB — HEMOGLOBIN A1C
Hemoglobin A1C: 9 %
eAG: 211.6 mg/dL

## 2017-05-17 ENCOUNTER — Ambulatory Visit: Payer: Self-pay | Admitting: Registered Nurse

## 2017-05-17 VITALS — BP 153/82 | HR 76 | Temp 97.5°F

## 2017-05-17 DIAGNOSIS — J0101 Acute recurrent maxillary sinusitis: Secondary | ICD-10-CM

## 2017-05-17 DIAGNOSIS — H6983 Other specified disorders of Eustachian tube, bilateral: Secondary | ICD-10-CM

## 2017-05-17 MED ORDER — ACETAMINOPHEN 500 MG PO TABS: 1000.0000 mg | ORAL_TABLET | Freq: Four times a day (QID) | ORAL | 0 refills | Status: AC | PRN

## 2017-05-17 MED ORDER — SALINE SPRAY 0.65 % NA SOLN: 2.0000 | NASAL | 0 refills | Status: DC

## 2017-05-17 MED ORDER — FLUTICASONE PROPIONATE 50 MCG/ACT NA SUSP: 1.0000 | Freq: Two times a day (BID) | NASAL | 0 refills | Status: DC

## 2017-05-17 MED ORDER — AMOXICILLIN-POT CLAVULANATE 875-125 MG PO TABS: 1.0000 | ORAL_TABLET | Freq: Two times a day (BID) | ORAL | 0 refills | Status: AC

## 2017-05-17 NOTE — Progress Notes (Signed)
Subjective:    Patient ID: Mariah Sutton, female    DOB: [birth year], 56 y.o.   MRN: [id number]  56y/o caucasian female established Pt c/o frontal and maxillary sinus pain and pressure x6 days. Productive cough. Denies rhinorrhea, otalgia, sore throat, fevers. Has been using saline nasal spray at home. Tried [employer] steroid nose spray, nasal saline and bc powder without relief.  Headache and cheek pain.  Has taken augmentin, clindamycin and cleocin in the past without side effects.  Denied worst headache of her life, chest pain, wheezing, hemoptysis.  Is using her inhaler prn.  Does not want steroids at this time. Doesn't feel as bad as when she was on steroids last time.  Doesn't like to wear mask makes it hard for her to breath.  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      Review of Systems  Constitutional: Negative for activity change, appetite change, chills, diaphoresis, fatigue, fever and unexpected weight change.  HENT: Positive for congestion, postnasal drip, sinus pressure and sinus pain. Negative for dental problem, drooling, ear discharge, ear pain, facial swelling, hearing loss, mouth sores, nosebleeds, rhinorrhea, sneezing, sore throat, tinnitus, trouble swallowing and voice change.   Eyes: Negative for photophobia, pain, discharge, redness, itching and visual disturbance.  Respiratory: Positive for cough. Negative for choking, chest tightness, shortness of breath, wheezing and stridor.   Cardiovascular: Negative for chest pain.  Gastrointestinal: Negative for blood in stool, constipation, diarrhea, nausea and vomiting.  Endocrine: Negative for cold intolerance and heat intolerance.  Genitourinary: Negative for difficulty urinating, dysuria and hematuria.  Musculoskeletal: Negative for myalgias, neck pain and neck stiffness.  Skin: Negative for rash.  Allergic/Immunologic: Positive for environmental allergies. Negative for food allergies.  Neurological: Positive for headaches.  Negative for dizziness, tremors, seizures, syncope, facial asymmetry, speech difficulty, weakness, light-headedness and numbness.  Hematological: Negative for adenopathy. Does not bruise/bleed easily.  Psychiatric/Behavioral: Negative for agitation, behavioral problems, confusion and sleep disturbance.       Objective:   Physical Exam  Constitutional: She is oriented to person, place, and time. She appears well-developed and well-nourished. She is active and cooperative.  Non-toxic appearance. She does not have a sickly appearance. She appears ill. No distress.  HENT:  Head: Normocephalic and atraumatic.  Right Ear: Hearing, external ear and ear canal normal. A middle ear effusion is present.  Left Ear: Hearing, external ear and ear canal normal. A middle ear effusion is present.  Nose: Mucosal edema and rhinorrhea present. No nose lacerations, sinus tenderness, nasal deformity, septal deviation or nasal septal hematoma. No epistaxis.  No foreign bodies. Right sinus exhibits maxillary sinus tenderness and frontal sinus tenderness. Left sinus exhibits maxillary sinus tenderness and frontal sinus tenderness.  Mouth/Throat: Uvula is midline and mucous membranes are normal. Mucous membranes are not pale, not dry and not cyanotic. She does not have dentures. No oral lesions. No trismus in the jaw. Abnormal dentition. No dental abscesses, uvula swelling, lacerations or dental caries. Posterior oropharyngeal edema and posterior oropharyngeal erythema present. No oropharyngeal exudate or tonsillar abscesses.  Cobblestoning posterior pharynx; bilateral allergic shiners and lower eyelid swelling bilaterally; bilateral TMs air fluid level clear; bilateral nasal turbinates edema/erythema/clear discharge; maxillary greater than frontal tenderness to palpation bilaterally; patient missing some teeth, teeth strained brown upper and lower  Eyes: Conjunctivae, EOM and lids are normal. Pupils are equal, round, and  reactive to light. Right eye exhibits no chemosis, no discharge, no exudate and no hordeolum. No foreign body present in  the right eye. Left eye exhibits no chemosis, no discharge, no exudate and no hordeolum. No foreign body present in the left eye. Right conjunctiva is not injected. Right conjunctiva has no hemorrhage. Left conjunctiva is not injected. Left conjunctiva has no hemorrhage. No scleral icterus. Right eye exhibits normal extraocular motion and no nystagmus. Left eye exhibits normal extraocular motion and no nystagmus. Right pupil is round and reactive. Left pupil is round and reactive. Pupils are equal.  Neck: Trachea normal, normal range of motion and phonation normal. Neck supple. No tracheal tenderness and no muscular tenderness present. No neck rigidity. No tracheal deviation, no edema, no erythema and normal range of motion present. No thyroid mass and no thyromegaly present.  Cardiovascular: Normal rate, regular rhythm, S1 normal, S2 normal, normal heart sounds and intact distal pulses. PMI is not displaced. Exam reveals no gallop and no friction rub.  No murmur heard. Pulmonary/Chest: Effort normal and breath sounds normal. No accessory muscle usage or stridor. No respiratory distress. She has no decreased breath sounds. She has no wheezes. She has no rhonchi. She has no rales. She exhibits no tenderness.  Spoke full sentences without difficulty; rare nonproductive cough observed in exam room  Abdominal: Soft. Normal appearance. She exhibits no distension, no fluid wave and no ascites. There is no rigidity and no guarding.  Musculoskeletal: Normal range of motion. She exhibits no edema or tenderness.       Right shoulder: Normal.       Left shoulder: Normal.       Right elbow: Normal.      Left elbow: Normal.       Right hip: Normal.       Left hip: Normal.       Right knee: Normal.       Left knee: Normal.       Cervical back: Normal.       Right hand: Normal.       Left hand:  Normal.  Lymphadenopathy:       Head (right side): No submental, no submandibular, no tonsillar, no preauricular, no posterior auricular and no occipital adenopathy present.       Head (left side): No submental, no submandibular, no tonsillar, no preauricular, no posterior auricular and no occipital adenopathy present.    She has no cervical adenopathy.       Right cervical: No superficial cervical, no deep cervical and no posterior cervical adenopathy present.      Left cervical: No superficial cervical, no deep cervical and no posterior cervical adenopathy present.  Neurological: She is alert and oriented to person, place, and time. She has normal strength. She is not disoriented. She displays no atrophy and no tremor. No cranial nerve deficit or sensory deficit. She exhibits normal muscle tone. She displays no seizure activity. Coordination and gait normal. GCS eye subscore is 4. GCS verbal subscore is 5. GCS motor subscore is 6.  On/off exam table; in/out of chair without difficulty; gait sure and steady in hallway  Skin: Skin is warm, dry and intact. No abrasion, no bruising, no burn, no ecchymosis, no laceration, no lesion, no petechiae and no rash noted. She is not diaphoretic. No cyanosis or erythema. No pallor. Nails show no clubbing.  Psychiatric: She has a normal mood and affect. Her speech is normal and behavior is normal. Judgment and thought content normal. Cognition and memory are normal.  Nursing note and vitals reviewed.         Assessment &  Plan:  A-recurrent maxillary sinusitis, eustachian tube dysfunction bilaterally, elevated blood pressure  P-Continue flonase 1 spray each nostril BID at home use after nasal saline, saline 2 sprays each nostril q2h wa prn congestion given 1 bottle from clinic stock.  Tylenol 1000mg  po QID prn pain dispensed 4 UD from clinic stock.  start augmentin 875mg  po BID x 10 days #20 RF0.  Dispensed from PDRx given.  Denied personal or family history  of ENT cancer.  Shower BID especially prior to bed. No evidence of systemic bacterial infection, non toxic and well hydrated.  I do not see where any further testing or imaging is necessary at this time.   I will suggest supportive care, rest, good hygiene and encourage the patient to take adequate fluids.  The patient is to return to clinic or EMERGENCY ROOM if symptoms worsen or change significantly.  Exitcare handout on sinusitis and sinus rinse given to patient.  Patient verbalized agreement and understanding of treatment plan and had no further questions at this time.   P2:  Hand washing and cover cough  Supportive treatment.   No evidence of invasive bacterial infection, non toxic and well hydrated.  This is most likely self limiting viral infection.  I do not see where any further testing or imaging is necessary at this time.   I will suggest supportive care, rest, good hygiene and encourage the patient to take adequate fluids.  The patient is to return to clinic or EMERGENCY ROOM if symptoms worsen or change significantly e.g. ear pain, fever, purulent discharge from ears or bleeding.   Patient verbalized agreement and understanding of treatment plan.    Probable acute pain and BC powder use elevating blood pressure.  Follow up with RN for BP recheck when well for recheck.  Smoker.  ER if chest pain, dyspnea, worst headache of life for re-evaluation.  Patient verbalized understanding information/instructions, agreed with plan of care and had no further questions at this time

## 2017-05-17 NOTE — Patient Instructions (Signed)
Sinus Rinse What is a sinus rinse? A sinus rinse is a simple home treatment that is used to rinse your sinuses with a sterile mixture of salt and water (saline solution). Sinuses are air-filled spaces in your skull behind the bones of your face and forehead that open into your nasal cavity. You will use the following:  Saline solution.  Neti pot or spray bottle. This releases the saline solution into your nose and through your sinuses. Neti pots and spray bottles can be purchased at your local pharmacy, a health food store, or online.  When would I do a sinus rinse? A sinus rinse can help to clear mucus, dirt, dust, or pollen from the nasal cavity. You may do a sinus rinse when you have a cold, a virus, nasal allergy symptoms, a sinus infection, or stuffiness in the nose or sinuses. If you are considering a sinus rinse:  Ask your child's health care provider before performing a sinus rinse on your child.  Do not do a sinus rinse if you have had ear or nasal surgery, ear infection, or blocked ears.  How do I do a sinus rinse?  Wash your hands.  Disinfect your device according to the directions provided and then dry it.  Use the solution that comes with your device or one that is sold separately in stores. Follow the mixing directions on the package.  Fill your device with the amount of saline solution as directed by the device instructions.  Stand over a sink and tilt your head sideways over the sink.  Place the spout of the device in your upper nostril (the one closer to the ceiling).  Gently pour or squeeze the saline solution into the nasal cavity. The liquid should drain to the lower nostril if you are not overly congested.  Gently blow your nose. Blowing too hard may cause ear pain.  Repeat in the other nostril.  Clean and rinse your device with clean water and then air-dry it. Are there risks of a sinus rinse? Sinus rinse is generally very safe and effective. However,  there are a few risks, which include:  A burning sensation in the sinuses. This may happen if you do not make the saline solution as directed. Make sure to follow all directions when making the saline solution.  Infection from contaminated water. This is rare, but possible.  Nasal irritation.  This information is not intended to replace advice given to you by your health care provider. Make sure you discuss any questions you have with your health care provider. Document Released: 12/26/2013 Document Revised: 04/27/2016 Document Reviewed: 10/16/2013 Elsevier Interactive Patient Education  2017 [employer]. Sinusitis, Adult Sinusitis is soreness and inflammation of your sinuses. Sinuses are hollow spaces in the bones around your face. Your sinuses are located:  Around your eyes.  In the middle of your forehead.  Behind your nose.  In your cheekbones.  Your sinuses and nasal passages are lined with a stringy fluid (mucus). Mucus normally drains out of your sinuses. When your nasal tissues become inflamed or swollen, the mucus can become trapped or blocked so air cannot flow through your sinuses. This allows bacteria, viruses, and funguses to grow, which leads to infection. Sinusitis can develop quickly and last for 7?10 days (acute) or for more than 12 weeks (chronic). Sinusitis often develops after a cold. What are the causes? This condition is caused by anything that creates swelling in the sinuses or stops mucus from draining, including:  Allergies.    Asthma.  Bacterial or viral infection.  Abnormally shaped bones between the nasal passages.  Nasal growths that contain mucus (nasal polyps).  Narrow sinus openings.  Pollutants, such as chemicals or irritants in the air.  A foreign object stuck in the nose.  A fungal infection. This is rare.  What increases the risk? The following factors may make you more likely to develop this condition:  Having allergies or  asthma.  Having had a recent cold or respiratory tract infection.  Having structural deformities or blockages in your nose or sinuses.  Having a weak immune system.  Doing a lot of swimming or diving.  Overusing nasal sprays.  Smoking.  What are the signs or symptoms? The main symptoms of this condition are pain and a feeling of pressure around the affected sinuses. Other symptoms include:  Upper toothache.  Earache.  Headache.  Bad breath.  Decreased sense of smell and taste.  A cough that may get worse at night.  Fatigue.  Fever.  Thick drainage from your nose. The drainage is often green and it may contain pus (purulent).  Stuffy nose or congestion.  Postnasal drip. This is when extra mucus collects in the throat or back of the nose.  Swelling and warmth over the affected sinuses.  Sore throat.  Sensitivity to light.  How is this diagnosed? This condition is diagnosed based on symptoms, a medical history, and a physical exam. To find out if your condition is acute or chronic, your health care provider may:  Look in your nose for signs of nasal polyps.  Tap over the affected sinus to check for signs of infection.  View the inside of your sinuses using an imaging device that has a light attached (endoscope).  If your health care provider suspects that you have chronic sinusitis, you may also:  Be tested for allergies.  Have a sample of mucus taken from your nose (nasal culture) and checked for bacteria.  Have a mucus sample examined to see if your sinusitis is related to an allergy.  If your sinusitis does not respond to treatment and it lasts longer than 8 weeks, you may have an MRI or CT scan to check your sinuses. These scans also help to determine how severe your infection is. In rare cases, a bone biopsy may be done to rule out more serious types of fungal sinus disease. How is this treated? Treatment for sinusitis depends on the cause and  whether your condition is chronic or acute. If a virus is causing your sinusitis, your symptoms will go away on their own within 10 days. You may be given medicines to relieve your symptoms, including:  Topical nasal decongestants. They shrink swollen nasal passages and let mucus drain from your sinuses.  Antihistamines. These drugs block inflammation that is triggered by allergies. This can help to ease swelling in your nose and sinuses.  Topical nasal corticosteroids. These are nasal sprays that ease inflammation and swelling in your nose and sinuses.  Nasal saline washes. These rinses can help to get rid of thick mucus in your nose.  If your condition is caused by bacteria, you will be given an antibiotic medicine. If your condition is caused by a fungus, you will be given an antifungal medicine. Surgery may be needed to correct underlying conditions, such as narrow nasal passages. Surgery may also be needed to remove polyps. Follow these instructions at home: Medicines  Take, use, or apply over-the-counter and prescription medicines only as told by   your health care provider. These may include nasal sprays.  If you were prescribed an antibiotic medicine, take it as told by your health care provider. Do not stop taking the antibiotic even if you start to feel better. Hydrate and Humidify  Drink enough water to keep your urine clear or pale yellow. Staying hydrated will help to thin your mucus.  Use a cool mist humidifier to keep the humidity level in your home above 50%.  Inhale steam for 10-15 minutes, 3-4 times a day or as told by your health care provider. You can do this in the bathroom while a hot shower is running.  Limit your exposure to cool or dry air. Rest  Rest as much as possible.  Sleep with your head raised (elevated).  Make sure to get enough sleep each night. General instructions  Apply a warm, moist washcloth to your face 3-4 times a day or as told by your  health care provider. This will help with discomfort.  Wash your hands often with soap and water to reduce your exposure to viruses and other germs. If soap and water are not available, use hand sanitizer.  Do not smoke. Avoid being around people who are smoking (secondhand smoke).  Keep all follow-up visits as told by your health care provider. This is important. Contact a health care provider if:  You have a fever.  Your symptoms get worse.  Your symptoms do not improve within 10 days. Get help right away if:  You have a severe headache.  You have persistent vomiting.  You have pain or swelling around your face or eyes.  You have vision problems.  You develop confusion.  Your neck is stiff.  You have trouble breathing. This information is not intended to replace advice given to you by your health care provider. Make sure you discuss any questions you have with your health care provider. Document Released: 05/31/2005 Document Revised: 01/25/2016 Document Reviewed: 03/26/2015 Elsevier Interactive Patient Education  2017 Elsevier Inc.  

## 2018-01-24 ENCOUNTER — Other Ambulatory Visit: Payer: Self-pay | Admitting: Orthopedic Surgery

## 2018-01-24 DIAGNOSIS — M25561 Pain in right knee: Secondary | ICD-10-CM

## 2018-02-22 ENCOUNTER — Ambulatory Visit
Admission: RE | Admit: 2018-02-22 | Discharge: 2018-02-22 | Disposition: A | Discharge status: Home / Self Care | Payer: No Typology Code available for payment source | Source: Ambulatory Visit | Attending: Orthopedic Surgery | Admitting: Orthopedic Surgery

## 2018-02-22 DIAGNOSIS — X58XXXA Exposure to other specified factors, initial encounter: Secondary | ICD-10-CM | POA: Diagnosis not present

## 2018-02-22 DIAGNOSIS — S83281A Other tear of lateral meniscus, current injury, right knee, initial encounter: Secondary | ICD-10-CM | POA: Diagnosis not present

## 2018-02-22 DIAGNOSIS — S83241A Other tear of medial meniscus, current injury, right knee, initial encounter: Secondary | ICD-10-CM | POA: Insufficient documentation

## 2018-02-22 DIAGNOSIS — M25561 Pain in right knee: Secondary | ICD-10-CM | POA: Insufficient documentation

## 2018-04-07 ENCOUNTER — Other Ambulatory Visit: Payer: Self-pay

## 2018-04-07 ENCOUNTER — Encounter
Admission: RE | Admit: 2018-04-07 | Discharge: 2018-04-07 | Disposition: A | Discharge status: Home / Self Care | Payer: PRIVATE HEALTH INSURANCE | Source: Ambulatory Visit | Attending: Orthopedic Surgery | Admitting: Orthopedic Surgery

## 2018-04-07 DIAGNOSIS — Z01810 Encounter for preprocedural cardiovascular examination: Secondary | ICD-10-CM | POA: Insufficient documentation

## 2018-04-07 HISTORY — DX: Pain in unspecified knee: M25.569

## 2018-04-07 HISTORY — DX: Gastro-esophageal reflux disease without esophagitis: K21.9

## 2018-04-07 HISTORY — DX: Prediabetes: R73.03

## 2018-04-07 HISTORY — DX: Bronchitis, not specified as acute or chronic: J40

## 2018-04-07 NOTE — Patient Instructions (Signed)
Your procedure is scheduled on: 04/13/18 Thur Report to Same Day Surgery 2nd floor medical mall [facility] Entrance-take elevator on left to 2nd floor.  Check in with surgery information desk.) To find out your arrival time please call [phone] between 1PM - 3PM on 04/12/18 Wed  Remember: Instructions that are not followed completely may result in serious medical risk, up to and including death, or upon the discretion of your surgeon and anesthesiologist your surgery may need to be rescheduled.    _x___ 1. Do not eat food after midnight the night before your procedure. You may drink clear liquids up to 2 hours before you are scheduled to arrive at the hospital for your procedure.  Do not drink clear liquids within 2 hours of your scheduled arrival to the hospital.  Clear liquids include  --Water or Apple juice without pulp  --Clear carbohydrate beverage such as ClearFast or Gatorade  --Black Coffee or Clear Tea (No milk, no creamers, do not add anything to                  the coffee or Tea Type 1 and type 2 diabetics should only drink water.   ____Ensure clear carbohydrate drink on the way to the hospital for bariatric patients  ____Ensure clear carbohydrate drink 3 hours before surgery for Dr Rutherford Nail patients if physician instructed.   No gum chewing or hard candies.     __x__ 2. No Alcohol for 24 hours before or after surgery.   __x__3. No Smoking or e-cigarettes for 24 prior to surgery.  Do not use any chewable tobacco products for at least 6 hour prior to surgery   ____  4. Bring all medications with you on the day of surgery if instructed.    __x__ 5. Notify your doctor if there is any change in your medical condition     (cold, fever, infections).    x___6. On the morning of surgery brush your teeth with toothpaste and water.  You may rinse your mouth with mouth wash if you wish.  Do not swallow any toothpaste or mouthwash.   Do not wear jewelry, make-up, hairpins,  clips or nail polish.  Do not wear lotions, powders, or perfumes. You may wear deodorant.  Do not shave 48 hours prior to surgery. Men may shave face and neck.  Do not bring valuables to the hospital.    [facility] is not responsible for any belongings or valuables.               Contacts, dentures or bridgework may not be worn into surgery.  Leave your suitcase in the car. After surgery it may be brought to your room.  For patients admitted to the hospital, discharge time is determined by your                       treatment team.  _  Patients discharged the day of surgery will not be allowed to drive home.  You will need someone to drive you home and stay with you the night of your procedure.    Please read over the following fact sheets that you were given:   [facility] Preparing for Surgery and or MRSA Information   _x___ Take anti-hypertensive listed below, cardiac, seizure, asthma,     anti-reflux and psychiatric medicines. These include:  1. omeprazole (PRILOSEC) 40 MG capsule  2.  3.  4.  5.  6.  ____Fleets enema or  Magnesium Citrate as directed.   _x___ Use CHG Soap or sage wipes as directed on instruction sheet   ____ Use inhalers on the day of surgery and bring to hospital day of surgery  ____ Stop Metformin and Janumet 2 days prior to surgery.    ____ Take 1/2 of usual insulin dose the night before surgery and none on the morning     surgery.   _x___ Follow recommendations from Cardiologist, Pulmonologist or PCP regarding          stopping Aspirin, Coumadin, Plavix ,Eliquis, Effient, or Pradaxa, and Pletal.  X____Stop Anti-inflammatories such as Advil, Aleve, Ibuprofen, Motrin, Naproxen, Naprosyn, Goodies powders or aspirin products. OK to take Tylenol and                          Celebrex.   _x___ Stop supplements until after surgery.  But may continue Vitamin D, Vitamin B,       and multivitamin. Stop fish oils today.   ____ Bring C-Pap to the hospital.

## 2018-04-12 MED ORDER — CEFAZOLIN SODIUM-DEXTROSE 2-4 GM/100ML-% IV SOLN: 2.0000 g | Freq: Once | INTRAVENOUS | Status: DC

## 2018-04-13 ENCOUNTER — Ambulatory Visit: Payer: No Typology Code available for payment source | Admitting: Anesthesiology

## 2018-04-13 ENCOUNTER — Encounter
Admission: RE | Disposition: A | Discharge status: Home / Self Care | Payer: Self-pay | Source: Ambulatory Visit | Attending: Orthopedic Surgery

## 2018-04-13 ENCOUNTER — Ambulatory Visit
Admission: RE | Admit: 2018-04-13 | Discharge: 2018-04-13 | Disposition: A | Discharge status: Home / Self Care | Payer: No Typology Code available for payment source | Source: Ambulatory Visit | Attending: Orthopedic Surgery | Admitting: Orthopedic Surgery

## 2018-04-13 ENCOUNTER — Encounter: Payer: Self-pay | Admitting: *Deleted

## 2018-04-13 DIAGNOSIS — Z79899 Other long term (current) drug therapy: Secondary | ICD-10-CM | POA: Diagnosis not present

## 2018-04-13 DIAGNOSIS — Z6838 Body mass index (BMI) 38.0-38.9, adult: Secondary | ICD-10-CM | POA: Insufficient documentation

## 2018-04-13 DIAGNOSIS — X58XXXA Exposure to other specified factors, initial encounter: Secondary | ICD-10-CM | POA: Insufficient documentation

## 2018-04-13 DIAGNOSIS — M23241 Derangement of anterior horn of lateral meniscus due to old tear or injury, right knee: Secondary | ICD-10-CM | POA: Insufficient documentation

## 2018-04-13 DIAGNOSIS — M2241 Chondromalacia patellae, right knee: Secondary | ICD-10-CM | POA: Insufficient documentation

## 2018-04-13 DIAGNOSIS — M6751 Plica syndrome, right knee: Secondary | ICD-10-CM | POA: Diagnosis not present

## 2018-04-13 DIAGNOSIS — K219 Gastro-esophageal reflux disease without esophagitis: Secondary | ICD-10-CM | POA: Diagnosis not present

## 2018-04-13 DIAGNOSIS — M25561 Pain in right knee: Secondary | ICD-10-CM | POA: Diagnosis present

## 2018-04-13 DIAGNOSIS — S83221A Peripheral tear of medial meniscus, current injury, right knee, initial encounter: Secondary | ICD-10-CM | POA: Insufficient documentation

## 2018-04-13 DIAGNOSIS — Z87891 Personal history of nicotine dependence: Secondary | ICD-10-CM | POA: Insufficient documentation

## 2018-04-13 DIAGNOSIS — E782 Mixed hyperlipidemia: Secondary | ICD-10-CM | POA: Diagnosis not present

## 2018-04-13 HISTORY — PX: KNEE ARTHROSCOPY WITH MEDIAL MENISECTOMY: SHX5651

## 2018-04-13 HISTORY — PX: SYNOVECTOMY: SHX5180

## 2018-04-13 SURGERY — ARTHROSCOPY, KNEE, WITH MEDIAL MENISCECTOMY
Anesthesia: General | Laterality: Right

## 2018-04-13 MED ORDER — HYDROCODONE-ACETAMINOPHEN 5-325 MG PO TABS
1.0000 | ORAL_TABLET | Freq: Four times a day (QID) | ORAL | Status: DC | PRN
  Administered 2018-04-13: 1 via ORAL

## 2018-04-13 MED ORDER — PROPOFOL 10 MG/ML IV BOLUS
INTRAVENOUS | Status: DC | PRN
  Administered 2018-04-13: 150 mg via INTRAVENOUS

## 2018-04-13 MED ORDER — FENTANYL CITRATE (PF) 100 MCG/2ML IJ SOLN
25.0000 ug | INTRAMUSCULAR | Status: DC | PRN
  Administered 2018-04-13 (×2): 25 ug via INTRAVENOUS

## 2018-04-13 MED ORDER — FENTANYL CITRATE (PF) 100 MCG/2ML IJ SOLN
INTRAMUSCULAR | Status: DC | PRN
  Administered 2018-04-13 (×4): 50 ug via INTRAVENOUS

## 2018-04-13 MED ORDER — PROMETHAZINE HCL 25 MG/ML IJ SOLN: 6.2500 mg | INTRAMUSCULAR | Status: DC | PRN

## 2018-04-13 MED ORDER — CEFAZOLIN SODIUM-DEXTROSE 2-4 GM/100ML-% IV SOLN
INTRAVENOUS | Status: AC
  Filled 2018-04-13: qty 100

## 2018-04-13 MED ORDER — FENTANYL CITRATE (PF) 100 MCG/2ML IJ SOLN
INTRAMUSCULAR | Status: AC
  Administered 2018-04-13: 25 ug via INTRAVENOUS
  Filled 2018-04-13: qty 2

## 2018-04-13 MED ORDER — FENTANYL CITRATE (PF) 100 MCG/2ML IJ SOLN
INTRAMUSCULAR | Status: AC
  Filled 2018-04-13: qty 2

## 2018-04-13 MED ORDER — HYDROCODONE-ACETAMINOPHEN 5-325 MG PO TABS: 1.0000 | ORAL_TABLET | Freq: Four times a day (QID) | ORAL | 0 refills | Status: DC | PRN

## 2018-04-13 MED ORDER — ACETAMINOPHEN 10 MG/ML IV SOLN
INTRAVENOUS | Status: DC | PRN
  Administered 2018-04-13: 1000 mg via INTRAVENOUS

## 2018-04-13 MED ORDER — OXYCODONE HCL 5 MG/5ML PO SOLN: 5.0000 mg | Freq: Once | ORAL | Status: DC | PRN

## 2018-04-13 MED ORDER — ACETAMINOPHEN 10 MG/ML IV SOLN
INTRAVENOUS | Status: AC
  Filled 2018-04-13: qty 100

## 2018-04-13 MED ORDER — DEXAMETHASONE SODIUM PHOSPHATE 10 MG/ML IJ SOLN
INTRAMUSCULAR | Status: DC | PRN
  Administered 2018-04-13: 10 mg via INTRAVENOUS

## 2018-04-13 MED ORDER — OXYCODONE HCL 5 MG PO TABS: 5.0000 mg | ORAL_TABLET | Freq: Once | ORAL | Status: DC | PRN

## 2018-04-13 MED ORDER — HYDROCODONE-ACETAMINOPHEN 5-325 MG PO TABS
ORAL_TABLET | ORAL | Status: AC
  Filled 2018-04-13: qty 1

## 2018-04-13 MED ORDER — BUPIVACAINE-EPINEPHRINE (PF) 0.5% -1:200000 IJ SOLN
INTRAMUSCULAR | Status: AC
  Filled 2018-04-13: qty 30

## 2018-04-13 MED ORDER — LACTATED RINGERS IV SOLN
INTRAVENOUS | Status: DC
  Administered 2018-04-13: 20 mL/h via INTRAVENOUS

## 2018-04-13 MED ORDER — MIDAZOLAM HCL 2 MG/2ML IJ SOLN
INTRAMUSCULAR | Status: AC
  Filled 2018-04-13: qty 2

## 2018-04-13 MED ORDER — MIDAZOLAM HCL 2 MG/2ML IJ SOLN
INTRAMUSCULAR | Status: DC | PRN
  Administered 2018-04-13: 2 mg via INTRAVENOUS

## 2018-04-13 MED ORDER — CEFAZOLIN SODIUM-DEXTROSE 2-4 GM/100ML-% IV SOLN
2.0000 g | Freq: Once | INTRAVENOUS | Status: AC
  Administered 2018-04-13: 2 g via INTRAVENOUS

## 2018-04-13 MED ORDER — ONDANSETRON HCL 4 MG/2ML IJ SOLN
INTRAMUSCULAR | Status: DC | PRN
  Administered 2018-04-13: 4 mg via INTRAVENOUS

## 2018-04-13 MED ORDER — MEPERIDINE HCL 50 MG/ML IJ SOLN: 6.2500 mg | INTRAMUSCULAR | Status: DC | PRN

## 2018-04-13 SURGICAL SUPPLY — 28 items
BANDAGE ACE 4X5 VEL STRL LF (GAUZE/BANDAGES/DRESSINGS) ×2 IMPLANT
BLADE INCISOR PLUS 4.5 (BLADE) IMPLANT
CHLORAPREP W/TINT 26ML (MISCELLANEOUS) ×4 IMPLANT
COVER WAND RF STERILE (DRAPES) ×4 IMPLANT
CUFF TOURN 24 STER (MISCELLANEOUS) IMPLANT
CUFF TOURN 30 STER DUAL PORT (MISCELLANEOUS) ×2 IMPLANT
GAUZE SPONGE 4X4 12PLY STRL (GAUZE/BANDAGES/DRESSINGS) ×4 IMPLANT
GLOVE SURG SYN 9.0  PF PI (GLOVE) ×2
GLOVE SURG SYN 9.0 PF PI (GLOVE) ×2 IMPLANT
GOWN SRG 2XL LVL 4 RGLN SLV (GOWNS) ×2 IMPLANT
GOWN STRL NON-REIN 2XL LVL4 (GOWNS) ×4
GOWN STRL REUS W/ TWL LRG LVL3 (GOWN DISPOSABLE) ×4 IMPLANT
GOWN STRL REUS W/TWL LRG LVL3 (GOWN DISPOSABLE) ×8
IV LACTATED RINGER IRRG 3000ML (IV SOLUTION) ×8
IV LR IRRIG 3000ML ARTHROMATIC (IV SOLUTION) ×4 IMPLANT
KIT TURNOVER KIT A (KITS) ×4 IMPLANT
MANIFOLD NEPTUNE II (INSTRUMENTS) ×4 IMPLANT
NEEDLE HYPO 22GX1.5 SAFETY (NEEDLE) ×4 IMPLANT
PACK ARTHROSCOPY KNEE (MISCELLANEOUS) ×4 IMPLANT
SCALPEL PROTECTED #11 DISP (BLADE) ×4 IMPLANT
SET TUBE SUCT SHAVER OUTFL 24K (TUBING) ×4 IMPLANT
SET TUBE TIP INTRA-ARTICULAR (MISCELLANEOUS) ×4 IMPLANT
SUT ETHILON 4-0 (SUTURE) ×4
SUT ETHILON 4-0 FS2 18XMFL BLK (SUTURE) ×2
SUTURE ETHLN 4-0 FS2 18XMF BLK (SUTURE) ×2 IMPLANT
TUBING ARTHRO INFLOW-ONLY STRL (TUBING) ×4 IMPLANT
WAND COBLATION FLOW 50 (SURGICAL WAND) ×2 IMPLANT
WAND HAND CNTRL MULTIVAC 50 (MISCELLANEOUS) ×2 IMPLANT

## 2018-04-13 NOTE — OR Nursing (Signed)
Patient complaining of abdominal muscle spasm requiring her to stand and deep breathe.  She states that this happens frequently.

## 2018-04-13 NOTE — Transfer of Care (Signed)
Immediate Anesthesia Transfer of Care Note  Patient: Mariah Sutton  Procedure(s) Performed: KNEE ARTHROSCOPY WITH MEDIAL AND LATERAL MENISECTOMY (Right ) Partial SYNOVECTOMY  Patient Location: PACU  Anesthesia Type:General  Level of Consciousness: sedated  Airway & Oxygen Therapy: Patient Spontanous Breathing and Patient connected to face mask oxygen  Post-op Assessment: Report given to RN and Post -op Vital signs reviewed and stable  Post vital signs: Reviewed and stable  Last Vitals:  Vitals Value Taken Time  BP    Temp    Pulse    Resp    SpO2      Last Pain:  Vitals:   04/13/18 0759  TempSrc: Oral  PainSc: 1       Patients Stated Pain Goal: 0 (04/13/18 0759)  Complications: No apparent anesthesia complications

## 2018-04-13 NOTE — Discharge Instructions (Addendum)
Keep dressing clean and dry.  Pain medicine as directed.  Aspirin 325 mg daily until walking normally, at least 2 weeks   AMBULATORY SURGERY  DISCHARGE INSTRUCTIONS   1) The drugs that you were given will stay in your system until tomorrow so for the next 24 hours you should not:  A) Drive an automobile B) Make any legal decisions C) Drink any alcoholic beverage   2) You may resume regular meals tomorrow.  Today it is better to start with liquids and gradually work up to solid foods.  You may eat anything you prefer, but it is better to start with liquids, then soup and crackers, and gradually work up to solid foods.   3) Please notify your doctor immediately if you have any unusual bleeding, trouble breathing, redness and pain at the surgery site, drainage, fever, or pain not relieved by medication.    4) Additional Instructions:        Please contact your physician with any problems or Same Day Surgery at [phone], Monday through Friday 6 am to 4 pm, or [place] at [facility] number at [phone].

## 2018-04-13 NOTE — Anesthesia Preprocedure Evaluation (Signed)
Anesthesia Evaluation  Patient identified by MRN, date of birth, ID band Patient awake    Reviewed: Allergy & Precautions, NPO status , Patient's Chart, lab work & pertinent test results  History of Anesthesia Complications Negative for: history of anesthetic complications  Airway Mallampati: II  TM Distance: >3 FB Neck ROM: Full    Dental  (+) Poor Dentition, Missing, Loose,    Pulmonary neg sleep apnea, neg COPD, former smoker,    breath sounds clear to auscultation- rhonchi (-) wheezing      Cardiovascular Exercise Tolerance: Good (-) hypertension(-) CAD, (-) Past MI, (-) Cardiac Stents and (-) CABG  Rhythm:Regular Rate:Normal - Systolic murmurs and - Diastolic murmurs    Neuro/Psych negative neurological ROS  negative psych ROS   GI/Hepatic Neg liver ROS, GERD  ,  Endo/Other  Diabetes: prediabetic.  Renal/GU negative Renal ROS     Musculoskeletal negative musculoskeletal ROS (+)   Abdominal (+) + obese,   Peds  Hematology negative hematology ROS (+)   Anesthesia Other Findings Past Medical History: No date: Bronchitis No date: GERD (gastroesophageal reflux disease) No date: High cholesterol No date: Knee pain     Comment:  Right No date: Pneumonia No date: Pre-diabetes   Reproductive/Obstetrics negative OB ROS                             Anesthesia Physical Anesthesia Plan  ASA: II  Anesthesia Plan: General   Post-op Pain Management:    Induction: Intravenous  PONV Risk Score and Plan: 2 and Ondansetron, Dexamethasone and Midazolam  Airway Management Planned: LMA  Additional Equipment:   Intra-op Plan:   Post-operative Plan:   Informed Consent: I have reviewed the patients History and Physical, chart, labs and discussed the procedure including the risks, benefits and alternatives for the proposed anesthesia with the patient or authorized representative who has  indicated his/her understanding and acceptance.   Dental advisory given  Plan Discussed with: CRNA and Anesthesiologist  Anesthesia Plan Comments:         Anesthesia Quick Evaluation

## 2018-04-13 NOTE — H&P (Signed)
Reviewed paper H+P, will be scanned into chart. No changes noted.  

## 2018-04-13 NOTE — Op Note (Signed)
04/13/2018  10:22 AM  PATIENT:  Mariah Sutton  57 y.o. female  PRE-OPERATIVE DIAGNOSIS:  PERIPHERAL TEAR OF MEDIAL MENISCUS RIGHT KNEE, anterior tear lateral meniscus  POST-OPERATIVE DIAGNOSIS:  PERIPHERAL TEAR OF MEDIAL MENISCUS RIGHT KNEE, anterior tear lateral meniscus and plica band  PROCEDURE:  Procedure(s): KNEE ARTHROSCOPY WITH MEDIAL AND LATERAL MENISECTOMY (Right) Partial SYNOVECTOMY  SURGEON: Leitha Schuller, MD  ASSISTANTS: None  ANESTHESIA:   general  EBL:  No intake/output data recorded.  BLOOD ADMINISTERED:none  DRAINS: none   LOCAL MEDICATIONS USED:  NONE  SPECIMEN:  No Specimen  DISPOSITION OF SPECIMEN:  N/A  COUNTS:  YES  TOURNIQUET:  * Missing tourniquet times found for documented tourniquets in log: [id number] *  IMPLANTS: None  DICTATION: .Dragon Dictation patient was brought to the operating room and after adequate general anesthesia was obtained the right leg was prepped and draped the usual sterile fashion with a tourniquet and arthroscopic leg holder applied.  After patient verification and timeout procedures were completed, an inferolateral portal was made and the arthroscope introduced.  Initial inspection revealed a very large and thick plica band arising from the medial capsule that impinged on the patellofemoral joint this was subsequently ablated with the ArthroCare wand.  The patellofemoral joint was relatively normal in appearance with just some central chondromalacia of the patella but normal tracking.  Coming around the inferior medial portal a inferior medial portal was made and on probing there is a complex tear of the posterior third involving the inner half with predominantly vertical component this was subsequently ablated first with a meniscal punch and the want to a stable margin leaving the peripheral half of the meniscus intact there was very little chondromalacia in the medial compartment.  The ACL was intact in the lateral compartment there  is an anterior third tear of the lateral meniscus and this was ablated with the wand the meniscus and the lateral compartment was essentially normal the gutters were free of any loose bodies and after the meniscal pathology was addressed in the plica band ablated the knee was irrigated until clear and almost mentation withdrawn.  Pre-and post procedure pictures obtained.  No local was given as she has a cane allergy.  The wounds were closed with simple interrupted 4-0 nylon in a sterile dressing of Xeroform 4 x 4's web roll and Ace wrap applied  PLAN OF CARE: Discharge to home after PACU  PATIENT DISPOSITION:  PACU - hemodynamically stable.

## 2018-04-13 NOTE — Anesthesia Postprocedure Evaluation (Signed)
Anesthesia Post Note  Patient: Mariah Sutton  Procedure(s) Performed: KNEE ARTHROSCOPY WITH MEDIAL AND LATERAL MENISECTOMY (Right ) Partial SYNOVECTOMY  Patient location during evaluation: PACU Anesthesia Type: General Level of consciousness: awake and alert and oriented Pain management: pain level controlled Vital Signs Assessment: post-procedure vital signs reviewed and stable Respiratory status: spontaneous breathing, nonlabored ventilation and respiratory function stable Cardiovascular status: blood pressure returned to baseline and stable Postop Assessment: no signs of nausea or vomiting Anesthetic complications: no     Last Vitals:  Vitals:   04/13/18 1113 04/13/18 1138  BP: (!) 142/68 131/70  Pulse: 91 83  Resp: 14 16  Temp: (!) 36.3 C   SpO2: 95% 95%    Last Pain:  Vitals:   04/13/18 1138  TempSrc:   PainSc: 3                  Javi Bollman

## 2018-04-13 NOTE — Anesthesia Procedure Notes (Signed)
Procedure Name: LMA Insertion Date/Time: 04/13/2018 9:28 AM Performed by: Junious Silk, CRNA Pre-anesthesia Checklist: Patient identified, Patient being monitored, Timeout performed, Emergency Drugs available and Suction available Patient Re-evaluated:Patient Re-evaluated prior to induction Oxygen Delivery Method: Circle system utilized Preoxygenation: Pre-oxygenation with 100% oxygen Induction Type: IV induction Ventilation: Mask ventilation without difficulty LMA: LMA inserted LMA Size: 3.5 Tube type: Oral Number of attempts: 1 Placement Confirmation: positive ETCO2 and breath sounds checked- equal and bilateral Tube secured with: Tape Dental Injury: Teeth and Oropharynx as per pre-operative assessment

## 2018-04-13 NOTE — Anesthesia Post-op Follow-up Note (Signed)
Anesthesia QCDR form completed.        

## 2018-05-04 ENCOUNTER — Other Ambulatory Visit: Payer: Self-pay

## 2018-05-04 ENCOUNTER — Encounter: Payer: Self-pay | Admitting: Physical Therapy

## 2018-05-04 ENCOUNTER — Ambulatory Visit: Payer: PRIVATE HEALTH INSURANCE | Attending: Orthopedic Surgery | Admitting: Physical Therapy

## 2018-05-04 DIAGNOSIS — R262 Difficulty in walking, not elsewhere classified: Secondary | ICD-10-CM | POA: Insufficient documentation

## 2018-05-04 DIAGNOSIS — M25561 Pain in right knee: Secondary | ICD-10-CM | POA: Insufficient documentation

## 2018-05-04 DIAGNOSIS — G8929 Other chronic pain: Secondary | ICD-10-CM | POA: Diagnosis present

## 2018-05-04 NOTE — Therapy (Signed)
[facility] PHYSICAL AND SPORTS MEDICINE [address], [place], [address] Phone: [phone]   Fax:  [phone]  Physical Therapy Evaluation  Patient Details  Name: Mariah Sutton MRN: [id number] Date of Birth: [birth year] Referring Provider (PT):  [name], [place]   Encounter Date: 05/04/2018  PT End of Session - 05/04/18 1412    Visit Number  1    Number of Visits  13    Date for PT Re-Evaluation  06/15/18    Authorization Type  MEDCOST    Authorization Time Period  Current cert period: 05/04/2018 - 06/15/2018 (last PN: IE 05/04/2018);    Authorization - Visit Number  1    Authorization - Number of Visits  10    PT Start Time  1317    PT Stop Time  1410    PT Time Calculation (min)  53 min    Activity Tolerance  Patient tolerated treatment well;Patient limited by pain    Behavior During Therapy  WFL for tasks assessed/performed       Past Medical History:  Diagnosis Date  . Bronchitis   . GERD (gastroesophageal reflux disease)   . High cholesterol   . Knee pain    Right  . Pneumonia   . Pre-diabetes     Past Surgical History:  Procedure Laterality Date  . BREAST CYST ASPIRATION Bilateral 2000  . CESAREAN SECTION    . KNEE ARTHROSCOPY WITH MEDIAL MENISECTOMY Right 04/13/2018   Procedure: KNEE ARTHROSCOPY WITH MEDIAL AND LATERAL MENISECTOMY;  Surgeon: Kennedy Bucker, MD;  Location: [facility] ORS;  Service: Orthopedics;  Laterality: Right;  . SYNOVECTOMY  04/13/2018   Procedure: Partial SYNOVECTOMY;  Surgeon: Kennedy Bucker, MD;  Location: [facility] ORS;  Service: Orthopedics;;   SUBJECTIVE: HISTORY:  Patient is a 57 y.o. female who presents to outpatient physical therapy with a referral for  s/p R knee arthroscopy on 04/13/2018, stiffness. This patient's chief complaints consist of pain, stiffness, altered gait pattern, leading to the following functional deficits: difficulty with ambulation and weight bearing activities.  Relevant past medical history and comorbidities include borderline diabetes, obesity, chronicity of symptoms.  Response to previously administered skilled services: prior to surgery she completed PT and experienced worsening pain that caused her not to be able to walk and she had surgery.   Patient states condition started before the surgery. She is unsure what caused her pain originally. She gradually had increasing knee pain and was referred to PT. She was in physical therapy and was doing squats when she suddenly could not walk on it anymore. She was doing her HEP and the next morning she couldn't walk on it. She was having problems and after the surgery she was not walking the way they expected her to so he suggested she do PT to help her walk the way she should.  Denies history of low back pain.   FUNCTION Patient-reported Outcome Measure: FOTO = 44 Work: full time Estate manager/land agent (working Neurosurgeon, standing all day). May try to return on 4 weeks.  Hobbies: grandkids (go to park, baking and making crafts around the house).  PLOF: no limitations at work, caring for grandkids, ADLs, IADLs, community ambulation, stairs. Current Level of Function: unable to work, unable to care for grandkids, difficulty with all weight bearing activities.   PAIN Location: anterior right knee and medial and lateral right knee radiating proximal in medial and lateral thigh. Lateral thigh is worse when ambulating. Nature: burning pain  at lateral thigh.  Pain Scale:  Patient reports current pain as 4/10, at best 0/10, at worst 5/10, and during functional activity 5/10. Paresthesia: just proximal to right patella Aggravating factors: walking, weight bearing, stairs, getting up and down from chair.  Easing factors: elevation, ice.  24-hour pattern: worse in the morning.   There were no vitals filed for this visit.   Subjective Assessment - 05/04/18 1335    Subjective  Patient states condition started before  the surgery. She is unsure what caused her pain originally. She gradually had increasing knee pain and was referred to PT. She was in physical therapy and was doing squats when she suddenly could not walk on it anymore. She was doing her HEP and the next morning she couldn't walk on it. She was having problems and after the surgery she was not walking the way they expected her to so he suggested she do PT to help her walk the way she should.  Denies history of low back pain.     Pertinent History  Patient is a 57 y.o. female who presents to outpatient physical therapy with a referral for  s/p R knee arthroscopy, stiffness. This patient's chief complaints consist of pain, stiffness, altered gait pattern, leading to the following functional deficits: difficulty with ambulation and weight bearing activities. Relevant past medical history and comorbidities include borderline diabetes, obesity, history of cecerian section    Limitations  Lifting;Standing;Walking;House hold activities    How long can you sit comfortably?  2 hours    How long can you stand comfortably?  5 min    How long can you walk comfortably?  0 min    Diagnostic tests  prior to surgery (see chart)    Patient Stated Goals  get back to work, be able to stand and walk    Currently in Pain?  Yes    Pain Score  4     Pain Location  Knee    Pain Orientation  Right    Pain Descriptors / Indicators  Burning    Pain Type  Surgical pain    Pain Radiating Towards  medial and lateral distal thigh    Pain Onset  1 to 4 weeks ago    Pain Frequency  Intermittent    Aggravating Factors   walking, weight bearing, stairs, getting up and down from chair.     Pain Relieving Factors  elevation, ice.     Effect of Pain on Daily Activities  Current Level of Function: unable to work, unable to care for grandkids, difficulty with all weight bearing activities.          [facility] - 05/04/18 0001      Assessment   Medical Diagnosis   s/p R  knee arthroscopy, stiffness    Referring Provider (PT)   Patience MuscaGaines, Thomas Christopher, PA    Onset Date/Surgical Date  04/13/18    Next MD Visit  05/10/2018    Prior Therapy  yes unsuccessful prior to surgery      Precautions   Precautions  None      Restrictions   Weight Bearing Restrictions  No      Balance Screen   Has the patient fallen in the past 6 months  No    Has the patient had a decrease in activity level because of a fear of falling?   Yes    Is the patient reluctant to leave their home because of a fear of falling?  Yes      Home Environment   Living Environment  Private residence    Living Arrangements  Spouse/significant other    Type of Home  Mobile home    Home Access  Ramped entrance    Home Layout  One level    Home Equipment  Crutches      Prior Function   Level of Independence  Independent    Vocation  Full time employment    Vocation Requirements   full time maintenance tech (working Neurosurgeon, standing all day). May try to return on 4 weeks.     Leisure  grandkids (go to park, baking and making crafts around the house).       Cognition   Overall Cognitive Status  Within Functional Limits for tasks assessed      Observation/Other Assessments   Observations  see note from 05/04/2018 for latest objective data    Focus on Therapeutic Outcomes (FOTO)   44        OBJECTIVE: OBSERVATION/INSPECTION: Patient presents with apparently healing incision sites with no excessive redness, swelling, or heat. Steristrips covering portal incision sites.  PERIPHERAL JOINT MOTION (AROM/PROM in degrees):  *Indicates pain -  Hip grossly WFL bilaterally, except loss of extension. Knee - Flexion: R = 86/97* empty end feel, L = 122/125 firm end feel.  - Extension: R = -17*, L = 10. Ankle: grossly WFL for ADLs/ambulation   STRENGTH:  *Indicates pain Hip  - Flexion: R = 4+/5, L = 4+/5. - Extension: not tested at this time - Abduction: R = 4+/5, L =  4+/5. Knee - Ext: R = 3/5*, L = 5/5. - Flex: R = 3+/5*, L = 4/5. Ankle (seated position) grossly WNL and equal bilaterally   PALPATION: - TTP anterior, medial, and lateral joint line and lateral right knee/distal thigh..  FUNCTIONAL MOBILITY: - Bed mobility: supine <> sit I. - Transfers: sit <> stand I with difficulty due to pain and stiffness, prefers to use UE  - Gait: ambulates with wide stance, antalgic gait favoring right LE, lacks R knee flexion and extension.  - Stairs: not tested today  Objective measurements completed on examination: See above findings.     TREATMENT:  Denies sensitivity to latex Denies long term steroid use Denies spinal surgery  Therapeutic exercise: to centralize symptoms and improve ROM and strength required for successful completion of functional activities.  - supine heel slides, 5 second hold, x 10 - supine quad set with towel under knee, 5 second hold,  x 10 - hooklying bridge, x 10 -Education on HEP including handout  -Education on diagnosis, prognosis, POC, anatomy and physiology of current condition.   HOME EXERCISE PROGRAM Access Code: [email or web address]  URL: [email or web address]  Date: 05/04/2018  Prepared by: Norton Blizzard   Exercises  Supine Heel Slide with Strap - 10-15 reps - 5 second hold - 2-3 Sets - 2x daily - 7x weekly  Supine Quad Set - 10-15 reps - 5 second hold - 2-3 Sets - 2x daily - 7x weekly  Supine Bridge - 10-15 reps - 1 second hold - 3 Sets - 2x daily - 7x weekly   Patient response to treatment:  Pt tolerated treatment well. Pt was able to complete all exercises with minimal to no lasting increase in pain or discomfort. Pt required cuing for proper technique and to facilitate improved neuromuscular control, strength, range of motion, and functional ability.     PT Education - 05/04/18 1409  Education Details  -Education on diagnosis, prognosis, POC, anatomy and physiology of current condition.-Education on  HEP including handout. cuing for exercise technique/purpose    Person(s) Educated  Patient    Methods  Explanation;Demonstration;Tactile cues;Verbal cues;Handout    Comprehension  Verbalized understanding;Returned demonstration       PT Short Term Goals - 05/04/18 1506      PT SHORT TERM GOAL #1   Title  Be independent with home exercise program completed at least 3 times per week for self-management of symptoms.    Baseline  Initial HEP provided at initial eval (05/04/2018);     Time  2    Period  Weeks    Status  New    Target Date  05/18/18        PT Long Term Goals - 05/04/18 1507      PT LONG TERM GOAL #1   Title  Be independent with a long-term home exercise program for self-management of symptoms.     Baseline  Initial HEP provided at IE (05/04/2018);    Time  6    Period  Weeks    Status  New    Target Date  06/15/18      PT LONG TERM GOAL #2   Title  Patient will demonstrate improved ability to perform functional tasks as exhibited by at least 10 point improvement in FOTO score    Baseline  Initial 44 (05/04/2018);     Time  6    Period  Weeks    Status  New    Target Date  06/15/18      PT LONG TERM GOAL #3   Title  Patient will increase BLE gross strength to 4+/5 as to improve functional strength for independent gait, increased standing tolerance and increased ADL ability.    Baseline  see objective exam    Time  6    Period  Weeks    Status  New    Target Date  06/15/18      PT LONG TERM GOAL #4   Title  Have right knee AROM equal or greater than 0-125 with no increase in pain except intermittent end range discomfort to allow patient to complete valued activities with less difficulty.     Baseline  -17-0-86 (ext- neutral- flex) (05/04/2018);    Time  6    Period  Weeks    Status  New    Target Date  06/15/18      PT LONG TERM GOAL #5   Title  Complete community, work and/or recreational activities without limitation due to current condition.      Baseline  unable to work, unable to keep grandchildren (05/04/2018);     Time  6    Period  Weeks    Status  New    Target Date  06/15/18             Plan - 05/04/18 1414    Clinical Impression Statement  Patient is a 57 y.o. female referred to outpatient physical therapy with a diagnosis of s/p R knee arthroscopy who presents with signs and symptoms consistent with right knee pain s/p right knee arthroscopy leading to difficulty with weight bearing and functional tasks    History and Personal Factors relevant to plan of care:  borderline diabetes, obesity, chronicity of symptoms.     Clinical Presentation  Evolving    Clinical Presentation due to:  history of failed PT, gradually improving but still not able  to ambulate as well as physician expected    Clinical Decision Making  Low    Rehab Potential  Good    Clinical Impairments Affecting Rehab Potential  (+) motivation, goal to return to work; (-) chronicity of symptoms, history of failed PT    PT Frequency  2x / week    PT Duration  6 weeks    PT Treatment/Interventions  ADLs/Self Care Home Management;Cryotherapy;Aquatic Therapy;Electrical Stimulation;Moist Heat;Gait training;Stair training;Functional mobility training;Therapeutic activities;Therapeutic exercise;Balance training;Neuromuscular re-education;Patient/family education;Manual techniques;Scar mobilization;Passive range of motion;Dry needling;Taping;Spinal Manipulations;Joint Manipulations;Other (comment)   joint moblization grade I-V   PT Next Visit Plan  assess response to HEP and progress exercises as appropriate.     PT Home Exercise Plan  Medbridge Access Code: [email or web address]     Consulted and Agree with Plan of Care  Patient      ASSESSMENT:  Patient is a 57 y.o. female referred to outpatient physical therapy with a diagnosis of s/p R knee arthroscopy who presents with signs and symptoms consistent with right knee pain s/p right knee arthroscopy leading to difficulty with  weight bearing and functional tasks  Patient will benefit from skilled therapeutic intervention in order to improve the following deficits and impairments:  Abnormal gait, Decreased endurance, Decreased mobility, Difficulty walking, Decreased range of motion, Decreased scar mobility, Impaired perceived functional ability, Obesity, Decreased activity tolerance, Decreased strength, Impaired flexibility, Pain, Postural dysfunction  Visit Diagnosis: Chronic pain of right knee  Difficulty in walking, not elsewhere classified     Problem List Patient Active Problem List   Diagnosis Date Noted  . Borderline diabetes 04/25/2014  . Obesity (BMI 35.0-39.9 without comorbidity) 04/25/2014  . Pure hypercholesterolemia 04/25/2014    Cira Rue, PT, DPT 05/04/2018, 3:13 PM  [facility] PHYSICAL AND SPORTS MEDICINE [address] [place], [address] Phone: [phone]   Fax:  [phone]  Name: Mariah Sutton MRN: [id number] Date of Birth: Sep 22, 1960

## 2018-05-09 ENCOUNTER — Encounter: Payer: Self-pay | Admitting: Physical Therapy

## 2018-05-09 ENCOUNTER — Ambulatory Visit: Payer: PRIVATE HEALTH INSURANCE | Admitting: Physical Therapy

## 2018-05-09 DIAGNOSIS — M25561 Pain in right knee: Secondary | ICD-10-CM | POA: Diagnosis not present

## 2018-05-09 DIAGNOSIS — R262 Difficulty in walking, not elsewhere classified: Secondary | ICD-10-CM

## 2018-05-09 DIAGNOSIS — G8929 Other chronic pain: Secondary | ICD-10-CM

## 2018-05-09 NOTE — Therapy (Signed)
[place] [facility] [address], [place], [address] Phone: [phone]   Fax:  [phone]  Physical Therapy Treatment  Patient Details  Name: Mariah Sutton MRN: [id number] Date of Birth: 02-11-1961 Referring Provider (PT):  [name], [place]   Encounter Date: 05/09/2018  PT End of Session - 05/09/18 1345    Visit Number  2    Number of Visits  13    Date for PT Re-Evaluation  06/15/18    Authorization Type  MEDCOST    Authorization Time Period  Current cert period: 05/04/2018 - 06/15/2018 (last PN: IE 05/04/2018);    Authorization - Visit Number  2    Authorization - Number of Visits  10    PT Start Time  1335    PT Stop Time  1420    PT Time Calculation (min)  45 min    Activity Tolerance  Patient tolerated treatment well;Patient limited by pain    Behavior During Therapy  WFL for tasks assessed/performed       Past Medical History:  Diagnosis Date  . Bronchitis   . GERD (gastroesophageal reflux disease)   . High cholesterol   . Knee pain    Right  . Pneumonia   . Pre-diabetes     Past Surgical History:  Procedure Laterality Date  . BREAST CYST ASPIRATION Bilateral 2000  . CESAREAN SECTION    . KNEE ARTHROSCOPY WITH MEDIAL MENISECTOMY Right 04/13/2018   Procedure: KNEE ARTHROSCOPY WITH MEDIAL AND LATERAL MENISECTOMY;  Surgeon: Kennedy Bucker, MD;  Location: [facility] ORS;  Service: Orthopedics;  Laterality: Right;  . SYNOVECTOMY  04/13/2018   Procedure: Partial SYNOVECTOMY;  Surgeon: Kennedy Bucker, MD;  Location: [facility];  Service: Orthopedics;;    There were no vitals filed for this visit.  Subjective Assessment - 05/09/18 1340    Subjective  Patient reports she is having some increased pain today after going shopping for the first time at [facility] yesterday. She states her R knee is about 6/10 pain over anterior, medial, and lateral portions.  She states she has been doing her HEP  twice if she feels she can handle it, but is usually once a day.  She reports no excessive pain or soreness after her last treatment session.     Pertinent History  Patient is a 57 y.o. female who presents to outpatient physical therapy with a referral for  s/p R knee arthroscopy, stiffness. This patient's chief complaints consist of pain, stiffness, altered gait pattern, leading to the following functional deficits: difficulty with ambulation and weight bearing activities. Relevant past medical history and comorbidities include borderline diabetes, obesity, history of cecerian section    Limitations  Lifting;Standing;Walking;House hold activities    How long can you sit comfortably?  2 hours    How long can you stand comfortably?  5 min    How long can you walk comfortably?  0 min    Diagnostic tests  prior to surgery (see chart)    Patient Stated Goals  get back to work, be able to stand and walk    Pain Score  6     Pain Location  Knee    Pain Orientation  Right    Pain Descriptors / Indicators  Sore    Pain Type  Surgical pain    Pain Onset  1 to 4 weeks ago    Pain Frequency  Intermittent  PT Education - 05/09/18 1344    Education Details  purpose/form for exercises, advice for self management    Person(s) Educated  Patient    Methods  Explanation;Demonstration;Tactile cues;Verbal cues    Comprehension  Verbalized understanding;Returned demonstration      OBJECTIVE: AAROM on bike: R knee flexion 102 degrees.   TREATMENT:  Denies sensitivity to latex Denies long term steroid use Denies spinal surgery  Therapeutic exercise: to centralize symptoms and improve ROM and strength required for successful completion of functional activities.  - Recumbent Bike with no added resistance. For improved lower extremity ROM, muscular endurance, and activity tolerance; and to induce the analgesic effect of aerobic exercise, stimulate joint nutrition, and prepare body structures and  systems for following interventions. Back and forth in available range of motion with self-overpressure at end range to improve joint ROM. Started at seat position 5 and moved to position 6 to allow full revolutions.  X 7 min total.  - supine heel slides, 5 second hold, x 20 - supine quad set with towel under knee, 5 second hold,  x 20 plus additional time for cuing and education on how to get appropriate muscles to activate.  - hooklying bridge, x 20 -Sidelying clam shell. Cuing to stack hips and prevent hip motion. x20 each side plus time for transitions and to learn activity.  - short arc quad with 5#, then 2# weights, 2x10 each side.(decreased weight on second set due to lasting increased pain with 5# weights).  - long arc quad with 2# weights, 2x 10 each side - standing hamstring curls with 5# ankle weights, 2x10 each side.  - heel raises on slant board 2x10  Therapeutic activities: for functional strengthening and improved functional activity tolerance. -Squats with BUE support focusing on glute activation and proper form with hip hinging, stabilized back, and tibial perpendicular to floor with knees behind toes.  - side stepping with band around and crossed behind legs held in hands, 2 x 10 feet each side CGA for safety.  -Balance: airex compliant surface tandem stance eyes open, x 1 min each side SBA for safety. Cuing for technique and posture. Unilateral UE touch down support.  HOME EXERCISE PROGRAM Access Code: [email or web address]  URL: [email or web address]  Date: 05/04/2018  Prepared by: Norton Blizzard   Exercises   Supine Heel Slide with Strap - 10-15 reps - 5 second hold - 2-3 Sets - 2x daily - 7x weekly   Supine Quad Set - 10-15 reps - 5 second hold - 2-3 Sets - 2x daily - 7x weekly   Supine Bridge - 10-15 reps - 1 second hold - 3 Sets - 2x daily - 7x weekly   Patient response to treatment:  Pt tolerated treatment well. Pt was able to complete all exercises including  progressions with minimal to no lasting increase in pain or discomfort. Patient did report incresed pain with short arc quads with 5# ankle weight so weight was decreased to 2#. Pt reported overall reduction in pain by end of session. She struggled to properly activate quad with quad set but improved with cuing. Was educated about possible DOMS, aggravation of symptoms, and instructed on appropriate responses to that or continued improvement until next visit. Pt required cuing for proper technique and to facilitate improved neuromuscular control, strength, range of motion, and functional ability. Patient is making progress towards goals at this point.   PT Short Term Goals - 05/04/18 1506      PT SHORT TERM GOAL #1  Title  Be independent with home exercise program completed at least 3 times per week for self-management of symptoms.    Baseline  Initial HEP provided at initial eval (05/04/2018);     Time  2    Period  Weeks    Status  New    Target Date  05/18/18        PT Long Term Goals - 05/04/18 1507      PT LONG TERM GOAL #1   Title  Be independent with a long-term home exercise program for self-management of symptoms.     Baseline  Initial HEP provided at IE (05/04/2018);    Time  6    Period  Weeks    Status  New    Target Date  06/15/18      PT LONG TERM GOAL #2   Title  Patient will demonstrate improved ability to perform functional tasks as exhibited by at least 10 point improvement in FOTO score    Baseline  Initial 44 (05/04/2018);     Time  6    Period  Weeks    Status  New    Target Date  06/15/18      PT LONG TERM GOAL #3   Title  Patient will increase BLE gross strength to 4+/5 as to improve functional strength for independent gait, increased standing tolerance and increased ADL ability.    Baseline  see objective exam    Time  6    Period  Weeks    Status  New    Target Date  06/15/18      PT LONG TERM GOAL #4   Title  Have right knee AROM equal or greater  than 0-125 with no increase in pain except intermittent end range discomfort to allow patient to complete valued activities with less difficulty.     Baseline  -17-0-86 (ext- neutral- flex) (05/04/2018);    Time  6    Period  Weeks    Status  New    Target Date  06/15/18      PT LONG TERM GOAL #5   Title  Complete community, work and/or recreational activities without limitation due to current condition.     Baseline  unable to work, unable to keep grandchildren (05/04/2018);     Time  6    Period  Weeks    Status  New    Target Date  06/15/18            Plan - 05/09/18 1432    Clinical Impression Statement  Pt tolerated treatment well. Pt was able to complete all exercises including progressions with minimal to no lasting increase in pain or discomfort. Patient did report incresed pain with short arc quads with 5# ankle weight so weight was decreased to 2#. Pt reported overall reduction in pain by end of session. She struggled to properly activate quad with quad set but improved with cuing. Was educated about possible DOMS, aggravation of symptoms, and instructed on appropriate responses to that or continued improvement until next visit. Pt required cuing for proper technique and to facilitate improved neuromuscular control, strength, range of motion, and functional ability. Patient is making progress towards goals at this point.     Rehab Potential  Good    Clinical Impairments Affecting Rehab Potential  (+) motivation, goal to return to work; (-) chronicity of symptoms, history of failed PT    PT Frequency  2x / week    PT Duration  6 weeks    PT Treatment/Interventions  ADLs/Self Care Home Management;Cryotherapy;Aquatic Therapy;Electrical Stimulation;Moist Heat;Gait training;Stair training;Functional mobility training;Therapeutic activities;Therapeutic exercise;Balance training;Neuromuscular re-education;Patient/family education;Manual techniques;Scar mobilization;Passive range of  motion;Dry needling;Taping;Spinal Manipulations;Joint Manipulations;Other (comment)   joint moblization grade I-V   PT Next Visit Plan  assess response to last session and progress/regress exercises and upadate HEP as appropriate.     PT Home Exercise Plan  Medbridge Access Code: [email or web address]     Consulted and Agree with Plan of Care  Patient       Patient will benefit from skilled therapeutic intervention in order to improve the following deficits and impairments:  Abnormal gait, Decreased endurance, Decreased mobility, Difficulty walking, Decreased range of motion, Decreased scar mobility, Impaired perceived functional ability, Obesity, Decreased activity tolerance, Decreased strength, Impaired flexibility, Pain, Postural dysfunction  Visit Diagnosis: Chronic pain of right knee  Difficulty in walking, not elsewhere classified     Problem List Patient Active Problem List   Diagnosis Date Noted  . Borderline diabetes 04/25/2014  . Obesity (BMI 35.0-39.9 without comorbidity) 04/25/2014  . Pure hypercholesterolemia 04/25/2014    Cira Rue, PT, DPT 05/09/2018, 2:34 PM  [place] [facility] PH[facility] AND SPORTS MEDICINE [address] [place], [address] Phone: [phone]   Fax:  [phone]  Name: Mariah Sutton MRN: [id number] Date of Birth: [birth year]

## 2018-05-16 ENCOUNTER — Ambulatory Visit: Payer: No Typology Code available for payment source | Attending: Orthopedic Surgery | Admitting: Physical Therapy

## 2018-05-16 DIAGNOSIS — G8929 Other chronic pain: Secondary | ICD-10-CM | POA: Diagnosis present

## 2018-05-16 DIAGNOSIS — R262 Difficulty in walking, not elsewhere classified: Secondary | ICD-10-CM | POA: Insufficient documentation

## 2018-05-16 DIAGNOSIS — M25561 Pain in right knee: Secondary | ICD-10-CM | POA: Insufficient documentation

## 2018-05-16 NOTE — Therapy (Signed)
[place] [facility] [address], [place], [address] Phone: [phone]   Fax:  [phone]  Physical Therapy Treatment  Patient Details  Name: Mariah Sutton MRN: [id number] Date of Birth: July 13, 1960 Referring Provider (PT):  [name], [place]   Encounter Date: 05/16/2018  PT End of Session - 05/16/18 1357    Visit Number  3    Number of Visits  13    Date for PT Re-Evaluation  06/15/18    Authorization Type  MEDCOST    Authorization Time Period  Current cert period: 05/04/2018 - 06/15/2018 (last PN: IE 05/04/2018);    Authorization - Visit Number  3    Authorization - Number of Visits  10    PT Start Time  1350    PT Stop Time  1430    PT Time Calculation (min)  40 min    Activity Tolerance  Patient tolerated treatment well;Patient limited by pain    Behavior During Therapy  WFL for tasks assessed/performed       Past Medical History:  Diagnosis Date  . Bronchitis   . GERD (gastroesophageal reflux disease)   . High cholesterol   . Knee pain    Right  . Pneumonia   . Pre-diabetes     Past Surgical History:  Procedure Laterality Date  . BREAST CYST ASPIRATION Bilateral 2000  . CESAREAN SECTION    . KNEE ARTHROSCOPY WITH MEDIAL MENISECTOMY Right 04/13/2018   Procedure: KNEE ARTHROSCOPY WITH MEDIAL AND LATERAL MENISECTOMY;  Surgeon: Kennedy Bucker, MD;  Location: [facility];  Service: Orthopedics;  Laterality: Right;  . SYNOVECTOMY  04/13/2018   Procedure: Partial SYNOVECTOMY;  Surgeon: Kennedy Bucker, MD;  Location: [facility];  Service: Orthopedics;;    There were no vitals filed for this visit.  Subjective Assessment - 05/16/18 1351    Subjective  Patient reports she was pretty sore following last treatment session, mostly in the right thigh. She continues to have numbness over her right anterior thight and pain at the right knee and proximal to the knee medially and laterally.  She reports her  pain is "pretty low" today, about 3/10.  She states her HEP is going well. She is trying to do them 2x a day but sometimes she does and sometimes she doeesn't.     Pertinent History  Patient is a 57 y.o. female who presents to outpatient physical therapy with a referral for  s/p R knee arthroscopy, stiffness. This patient's chief complaints consist of pain, stiffness, altered gait pattern, leading to the following functional deficits: difficulty with ambulation and weight bearing activities. Relevant past medical history and comorbidities include borderline diabetes, obesity, history of cecerian section    Limitations  Lifting;Standing;Walking;House hold activities    How long can you sit comfortably?  2 hours    How long can you stand comfortably?  5 min    How long can you walk comfortably?  0 min    Diagnostic tests  prior to surgery (see chart)    Patient Stated Goals  get back to work, be able to stand and walk    Currently in Pain?  Yes    Pain Score  3     Pain Location  Knee    Pain Orientation  Right    Pain Descriptors / Indicators  Sore;Numbness    Pain Type  Surgical pain    Pain Radiating Towards  medial and lateral distal thigh  Pain Onset  1 to 4 weeks ago         PT Education - 05/16/18 1356    Education Details  purpuose/form for exercises, advice for self-management    Person(s) Educated  Patient    Methods  Explanation;Demonstration;Tactile cues;Verbal cues    Comprehension  Verbalized understanding;Returned demonstration      OBJECTIVE: AAROM on bike: R knee flexion 102 degrees.   TREATMENT: Denies sensitivity to latex Denies long term steroid use Denies spinal surgery  Therapeutic exercise:to centralize symptoms and improve ROM and strength required for successful completion of functional activities.  - Recumbent Bike with no added resistance. For improved lower extremity ROM, muscular endurance, and activity tolerance; and to induce the analgesic effect  of aerobic exercise, stimulate joint nutrition, and prepare body structures and systems for following interventions. Back and forth in available range of motion with self-overpressure at end range to improve joint ROM. Seat position 5.  X 6 min total during subjective exam. - supine heel slides, 5 second hold, x 20 - supine quad set with towel under knee, 5 second hold, x 20 plus additional time for cuing and education on how to get appropriate muscles to activate.  - hooklying bridge with round bolster under thoracic spine, hands on abdomen, x 20 - Sidelying clam shell with red theraband looped around knees. Cuing to stack hips and prevent hip motion. x20 each side plus time for transitions and to learn activity. - sidelying hip abduction with knee extended, x 20 each side. - prone TKE right side only, x 20 - prone hip extension with knees flexed to bias glute max. 2x10 each side - short arc quad with 4# weights, 3x10 each side.   Manual therapy: to reduce pain and tissue tension, improve range of motion, neuromodulation, in order to promote improved ability to complete functional activities. - STM with "the stick" assist over right quads to pt tolerance to decrease tension and pain.  - prone hip flexor stretch with contract-relax techinque one cycle each side.   HOME EXERCISE PROGRAM Access Code: [email or web address]  URL: [email or web address]  Date: 05/16/2018  Prepared by: Mariah Sutton    Exercises  Supine Heel Slide with Strap - 10-15 reps - 5 second hold - 2-3 Sets - 2x daily - 7x weekly  Supine Quad Set - 10-15 reps - 5 second hold - 2-3 Sets - 2x daily - 7x weekly  Supine Bridge - 10-15 reps - 1 second hold - 3 Sets - 2x daily - 7x weekly  Clamshell with Resistance - 10-15 reps - 1 second hold - 3 Sets - 1x daily - 3x weekly  Prone Terminal Knee Extension - 10-15 reps - 1 second hold - 3 Sets - 1x daily - 3x weekly    Patient response to treatment:  Pt tolerated treatment  well. Pt was able to complete all exercises including progressions with minimal to no lasting increase in pain or discomfort. Patient's HEP was updated to reflect increased tolerance for activities performed in clinic. Weight bearing exercises were avoided today to assess if that will reduce her soreness after this session compared to last.. Pt reported overall no worse pain by end of session. She struggled to properly activate quad with quad set but improved with cuing. Was educated about possible DOMS, aggravation of symptoms, and instructed on appropriate responses to that or continued improvement until next visit. Pt required cuing for proper technique and to facilitate improved neuromuscular control, strength, range of motion, and functional  ability. Patient is making progress towards goals at this point.    PT Short Term Goals - 05/17/18 0736      PT SHORT TERM GOAL #1   Title  Be independent with home exercise program completed at least 3 times per week for self-management of symptoms.    Baseline  Initial HEP provided at initial eval (05/04/2018);     Time  2    Period  Weeks    Status  Achieved    Target Date  05/18/18        PT Long Term Goals - 05/04/18 1507      PT LONG TERM GOAL #1   Title  Be independent with a long-term home exercise program for self-management of symptoms.     Baseline  Initial HEP provided at IE (05/04/2018);    Time  6    Period  Weeks    Status  New    Target Date  06/15/18      PT LONG TERM GOAL #2   Title  Patient will demonstrate improved ability to perform functional tasks as exhibited by at least 10 point improvement in FOTO score    Baseline  Initial 44 (05/04/2018);     Time  6    Period  Weeks    Status  New    Target Date  06/15/18      PT LONG TERM GOAL #3   Title  Patient will increase BLE gross strength to 4+/5 as to improve functional strength for independent gait, increased standing tolerance and increased ADL ability.    Baseline   see objective exam    Time  6    Period  Weeks    Status  New    Target Date  06/15/18      PT LONG TERM GOAL #4   Title  Have right knee AROM equal or greater than 0-125 with no increase in pain except intermittent end range discomfort to allow patient to complete valued activities with less difficulty.     Baseline  -17-0-86 (ext- neutral- flex) (05/04/2018);    Time  6    Period  Weeks    Status  New    Target Date  06/15/18      PT LONG TERM GOAL #5   Title  Complete community, work and/or recreational activities without limitation due to current condition.     Baseline  unable to work, unable to keep grandchildren (05/04/2018);     Time  6    Period  Weeks    Status  New    Target Date  06/15/18            Plan - 05/17/18 0735    Clinical Impression Statement  Pt tolerated treatment well. Pt was able to complete all exercises including progressions with minimal to no lasting increase in pain or discomfort. Patient's HEP was updated to reflect increased tolerance for activities performed in clinic. Weight bearing exercises were avoided today to assess if that will reduce her soreness after this session compared to last.. Pt reported overall no worse pain by end of session. She struggled to properly activate quad with quad set but improved with cuing. Was educated about possible DOMS, aggravation of symptoms, and instructed on appropriate responses to that or continued improvement until next visit. Pt required cuing for proper technique and to facilitate improved neuromuscular control, strength, range of motion, and functional ability. Patient is making progress towards goals at this point.  Rehab Potential  Good    Clinical Impairments Affecting Rehab Potential  (+) motivation, goal to return to work; (-) chronicity of symptoms, history of failed PT    PT Frequency  2x / week    PT Duration  6 weeks    PT Treatment/Interventions  ADLs/Self Care Home  Management;Cryotherapy;Aquatic Therapy;Electrical Stimulation;Moist Heat;Gait training;Stair training;Functional mobility training;Therapeutic activities;Therapeutic exercise;Balance training;Neuromuscular re-education;Patient/family education;Manual techniques;Scar mobilization;Passive range of motion;Dry needling;Taping;Spinal Manipulations;Joint Manipulations;Other (comment)   joint moblization grade I-V   PT Next Visit Plan  assess response to last session and progress/regress exercises and upadate HEP as appropriate.     PT Home Exercise Plan  Medbridge Access Code: [email or web address]     Consulted and Agree with Plan of Care  Patient       Patient will benefit from skilled therapeutic intervention in order to improve the following deficits and impairments:  Abnormal gait, Decreased endurance, Decreased mobility, Difficulty walking, Decreased range of motion, Decreased scar mobility, Impaired perceived functional ability, Obesity, Decreased activity tolerance, Decreased strength, Impaired flexibility, Pain, Postural dysfunction  Visit Diagnosis: Chronic pain of right knee  Difficulty in walking, not elsewhere classified     Problem List Patient Active Problem List   Diagnosis Date Noted  . Borderline diabetes 04/25/2014  . Obesity (BMI 35.0-39.9 without comorbidity) 04/25/2014  . Pure hypercholesterolemia 04/25/2014    Mariah Sutton, PT, DPT 05/17/2018, 7:37 AM  [place] [facility] PHYSICAL AND SPORTS MEDICINE [address], [place], [address] Phone: [phone]   Fax:  [phone]  Name: Mariah Sutton MRN: [id number] Date of Birth: 09-03-1960

## 2018-05-17 ENCOUNTER — Encounter: Payer: Self-pay | Admitting: Physical Therapy

## 2018-05-18 ENCOUNTER — Ambulatory Visit: Payer: No Typology Code available for payment source | Admitting: Physical Therapy

## 2018-05-18 ENCOUNTER — Encounter: Payer: Self-pay | Admitting: Physical Therapy

## 2018-05-18 DIAGNOSIS — M25561 Pain in right knee: Principal | ICD-10-CM

## 2018-05-18 DIAGNOSIS — R262 Difficulty in walking, not elsewhere classified: Secondary | ICD-10-CM

## 2018-05-18 DIAGNOSIS — G8929 Other chronic pain: Secondary | ICD-10-CM

## 2018-05-18 NOTE — Therapy (Signed)
[facility] [address], [place], [address] Phone: [phone]   Fax:  [phone]  Physical Therapy Treatment  Patient Details  Name: Mariah Sutton MRN: [id number] Date of Birth: [birth year] Referring Provider (PT):  [name], [place]   Encounter Date: 05/18/2018  PT End of Session - 05/18/18 1601    Visit Number  4    Number of Visits  13    Date for PT Re-Evaluation  06/15/18    Authorization Type  MEDCOST    Authorization Time Period  Current cert period: 05/04/2018 - 06/15/2018 (last PN: IE 05/04/2018);    Authorization - Visit Number  4    Authorization - Number of Visits  10    PT Start Time  1506    PT Stop Time  1545    PT Time Calculation (min)  39 min    Activity Tolerance  Patient tolerated treatment well;Patient limited by pain    Behavior During Therapy  WFL for tasks assessed/performed       Past Medical History:  Diagnosis Date  . Bronchitis   . GERD (gastroesophageal reflux disease)   . High cholesterol   . Knee pain    Right  . Pneumonia   . Pre-diabetes     Past Surgical History:  Procedure Laterality Date  . BREAST CYST ASPIRATION Bilateral 2000  . CESAREAN SECTION    . KNEE ARTHROSCOPY WITH MEDIAL MENISECTOMY Right 04/13/2018   Procedure: KNEE ARTHROSCOPY WITH MEDIAL AND LATERAL MENISECTOMY;  Surgeon: Kennedy Bucker, MD;  Location: [facility] ORS;  Service: Orthopedics;  Laterality: Right;  . SYNOVECTOMY  04/13/2018   Procedure: Partial SYNOVECTOMY;  Surgeon: Kennedy Bucker, MD;  Location: [facility] ORS;  Service: Orthopedics;;    There were no vitals filed for this visit.  Subjective Assessment - 05/18/18 1558    Subjective  Patient states she continues to be sore later in the day following treatment and her soreness continues to today. She is concerned about the continued swelling in her knee and does not feel ready to go back to work on Monday as her doctor had planned.  States she called their office already about that and plans to speak to them again. Feels strongly that it is too much weightbearing at her job to be practical. States her pain si 3/10 around the anterior knee today. Has been doing her HEP slowly and carefully.      Pertinent History  Patient is a 57 y.o. female who presents to outpatient physical therapy with a referral for  s/p R knee arthroscopy, stiffness. This patient's chief complaints consist of pain, stiffness, altered gait pattern, leading to the following functional deficits: difficulty with ambulation and weight bearing activities. Relevant past medical history and comorbidities include borderline diabetes, obesity, history of cecerian section    Limitations  Lifting;Standing;Walking;House hold activities    How long can you sit comfortably?  2 hours    How long can you stand comfortably?  5 min    How long can you walk comfortably?  0 min    Diagnostic tests  prior to surgery (see chart)    Patient Stated Goals  get back to work, be able to stand and walk    Pain Score  3     Pain Location  Knee    Pain Orientation  Right    Pain Descriptors / Indicators  Sore    Pain Type  Surgical pain  Pain Onset  1 to 4 weeks ago          PT Education - 05/18/18 1600    Education Details  purpose and form for exercises, advice for self-management. updated HEP    Person(s) Educated  Patient    Methods  Explanation;Demonstration;Tactile cues;Verbal cues;Handout    Comprehension  Verbalized understanding;Returned demonstration       OBJECTIVE: AAROM on bike: R knee flexion 108 degrees.  TREATMENT: Denies sensitivity to latex Denies long term steroid use Denies spinal surgery  Therapeutic exercise:to centralize symptoms and improve ROM and strength required for successful completion of functional activities. - NuStep level 1 using bilateral upper and lower extremities. Seated setting 5. For improved extremity mobility,  muscular endurance, and activity tolerance; and to induce the analgesic effect of aerobic exercise, stimulate improved joint nutrition, and prepare body structures and systems for following interventions. x 6 min  Minutes during subjective exam.  - supine heel slides, 5 second hold, x20 - supine quad set with towel under knee, 5 second hold, x3. - Straight leg raise with quad set and towel under knee, 2x 10 with cuing for proper quad activation.  - hooklying bridge with round bolster under thoracic spine, hands on abdomen, 2 x20 - sidelying hip abduction with knee extended, x 20 each side, 2# ankle weight.  - short arc quad with 4# weights, 3x10 each side.  - long arc quad with 4# weights, 3x10 each side. - prone TKE with 4# ankle weights draped across posterior knee, x 20 - prone hip extension with knees flexed to bias glute max x20, x10 each side  HOME EXERCISE PROGRAM Access Code: [email or web address]  URL: [name]  Date: 05/18/2018  Prepared by: Mariah Sutton   Exercises  Supine Heel Slide with Strap - 10-15 reps - 5 second hold - 2-3 Sets - 2x daily - 7x weekly  Hooklying Straight Leg Raise - 10-15 reps - 1 second hold - 3 Sets - 1x daily - 3x weekly  Supine Hamstring Stretch with Strap - 3 reps - 30 second hold - 1 Sets - 1x daily - 3x weekly  Clamshell with Resistance - 10-15 reps - 1 second hold - 3 Sets - 1x daily - 3x weekly  Prone Terminal Knee Extension - 10-15 reps - 1 second hold - 3 Sets - 1x daily - 3x weekly  Supine Bridge with Resistance Band - 10-15 reps - 1 second hold - 3 Sets - 1x daily - 3x weekly      Patient response to treatment:  Pt tolerated treatment well. Pt was able to complete all exercisesincluding progressionswith minimal to no lasting increase in pain or discomfort. Patient's HEP was updated to reflect increased tolerance for activities performed in clinic. Pt reported overall no worse pain by end of session. She demo improved  quad activation today. Did not perform CKC exercises due to pain level. .Pt required cuing for proper technique and to facilitate improved neuromuscular control, strength, range of motion, and functional ability.Patient is making progress towards goals at this point.   PT Short Term Goals - 05/17/18 0736      PT SHORT TERM GOAL #1   Title  Be independent with home exercise program completed at least 3 times per week for self-management of symptoms.    Baseline  Initial HEP provided at initial eval (05/04/2018);     Time  2    Period  Weeks    Status  Achieved    Target  Date  05/18/18        PT Long Term Goals - 05/04/18 1507      PT LONG TERM GOAL #1   Title  Be independent with a long-term home exercise program for self-management of symptoms.     Baseline  Initial HEP provided at IE (05/04/2018);    Time  6    Period  Weeks    Status  New    Target Date  06/15/18      PT LONG TERM GOAL #2   Title  Patient will demonstrate improved ability to perform functional tasks as exhibited by at least 10 point improvement in FOTO score    Baseline  Initial 44 (05/04/2018);     Time  6    Period  Weeks    Status  New    Target Date  06/15/18      PT LONG TERM GOAL #3   Title  Patient will increase BLE gross strength to 4+/5 as to improve functional strength for independent gait, increased standing tolerance and increased ADL ability.    Baseline  see objective exam    Time  6    Period  Weeks    Status  New    Target Date  06/15/18      PT LONG TERM GOAL #4   Title  Have right knee AROM equal or greater than 0-125 with no increase in pain except intermittent end range discomfort to allow patient to complete valued activities with less difficulty.     Baseline  -17-0-86 (ext- neutral- flex) (05/04/2018);    Time  6    Period  Weeks    Status  New    Target Date  06/15/18      PT LONG TERM GOAL #5   Title  Complete community, work and/or recreational activities without  limitation due to current condition.     Baseline  unable to work, unable to keep grandchildren (05/04/2018);     Time  6    Period  Weeks    Status  New    Target Date  06/15/18            Plan - 05/18/18 1602    Clinical Impression Statement  Pt tolerated treatment well. Pt was able to complete all exercisesincluding progressionswith minimal to no lasting increase in pain or discomfort. Patient's HEP was updated to reflect increased tolerance for activities performed in clinic. Pt reported overall no worse pain by end of session. She demo improved quad activation today. Did not perform CKC exercises due to pain level. .Pt required cuing for proper technique and to facilitate improved neuromuscular control, strength, range of motion, and functional ability.Patient is making progress towards goals at this point.    Rehab Potential  Good    Clinical Impairments Affecting Rehab Potential  (+) motivation, goal to return to work; (-) chronicity of symptoms, history of failed PT    PT Frequency  2x / week    PT Duration  6 weeks    PT Treatment/Interventions  ADLs/Self Care Home Management;Cryotherapy;Aquatic Therapy;Electrical Stimulation;Moist Heat;Gait training;Stair training;Functional mobility training;Therapeutic activities;Therapeutic exercise;Balance training;Neuromuscular re-education;Patient/family education;Manual techniques;Scar mobilization;Passive range of motion;Dry needling;Taping;Spinal Manipulations;Joint Manipulations;Other (comment)   joint moblization grade I-V   PT Next Visit Plan  assess response to last session and progress/regress exercises and upadate HEP as appropriate.     PT Home Exercise Plan  Medbridge Access Code: [email or web address]     Consulted and Agree with Plan of  Care  Patient       Patient will benefit from skilled therapeutic intervention in order to improve the following deficits and impairments:  Abnormal gait, Decreased endurance, Decreased mobility,  Difficulty walking, Decreased range of motion, Decreased scar mobility, Impaired perceived functional ability, Obesity, Decreased activity tolerance, Decreased strength, Impaired flexibility, Pain, Postural dysfunction  Visit Diagnosis: Chronic pain of right knee  Difficulty in walking, not elsewhere classified     Problem List Patient Active Problem List   Diagnosis Date Noted  . Borderline diabetes 04/25/2014  . Obesity (BMI 35.0-39.9 without comorbidity) 04/25/2014  . Pure hypercholesterolemia 04/25/2014    Cira Rue, PT, DPT 05/18/2018, 4:03 PM  [facility] [place] [facility] PHYS[facility] AND SPORTS MEDICINE [address] [place], [address] Phone: [phone]   Fax:  [phone]  Name: MICHAL STRZELECKI MRN: [id number] Date of Birth: 11/18/1960

## 2018-05-24 ENCOUNTER — Encounter: Payer: Self-pay | Admitting: Physical Therapy

## 2018-05-24 ENCOUNTER — Ambulatory Visit: Payer: No Typology Code available for payment source | Admitting: Physical Therapy

## 2018-05-24 DIAGNOSIS — R262 Difficulty in walking, not elsewhere classified: Secondary | ICD-10-CM

## 2018-05-24 DIAGNOSIS — M25561 Pain in right knee: Secondary | ICD-10-CM | POA: Diagnosis not present

## 2018-05-24 DIAGNOSIS — G8929 Other chronic pain: Secondary | ICD-10-CM

## 2018-05-24 NOTE — Therapy (Signed)
[place] [address] [place], [place], [address] Phone: [phone]   Fax:  [phone]  Physical Therapy Treatment  Patient Details  Name: Mariah Sutton MRN: [id number] Date of Birth: Oct 08, 1960 Referring Provider (PT):  Patience MuscaGaines, Thomas Christopher, [place]   Encounter Date: 05/24/2018  PT End of Session - 05/24/18 1038    Visit Number  5    Number of Visits  13    Date for PT Re-Evaluation  06/15/18    Authorization Type  MEDCOST    Authorization Time Period  Current cert period: 05/04/2018 - 06/15/2018 (last PN: IE 05/04/2018);    Authorization - Visit Number  5    Authorization - Number of Visits  10    PT Start Time  1035    PT Stop Time  1120    PT Time Calculation (min)  45 min    Activity Tolerance  Patient tolerated treatment well;Patient limited by pain    Behavior During Therapy  WFL for tasks assessed/performed       Past Medical History:  Diagnosis Date  . Bronchitis   . GERD (gastroesophageal reflux disease)   . High cholesterol   . Knee pain    Right  . Pneumonia   . Pre-diabetes     Past Surgical History:  Procedure Laterality Date  . BREAST CYST ASPIRATION Bilateral 2000  . CESAREAN SECTION    . KNEE ARTHROSCOPY WITH MEDIAL MENISECTOMY Right 04/13/2018   Procedure: KNEE ARTHROSCOPY WITH MEDIAL AND LATERAL MENISECTOMY;  Surgeon: Kennedy BuckerMenz, Michael, MD;  Location: [facility];  Service: Orthopedics;  Laterality: Right;  . SYNOVECTOMY  04/13/2018   Procedure: Partial SYNOVECTOMY;  Surgeon: Kennedy BuckerMenz, Michael, MD;  Location: [facility];  Service: Orthopedics;;    There were no vitals filed for this visit.  Subjective Assessment - 05/24/18 1035    Subjective  Patient reports she is feeling well and her knee is a bit sore but not painful. She states she was sore after last treatment session but it was tolerable. She state she spoke to her doctor about going back to work and he decided she will not go until  after seeing him again on 12/27 when he will decide if she is ready at that point. She reports she is able to stand longer and do more wieght bearing activities. She states her HEP is going okay. She is mixing them up. She has the most dififculty with the prone TKE due to lateral knee discomfort.     Pertinent History  Patient is a 57 y.o. female who presents to outpatient physical therapy with a referral for  s/p R knee arthroscopy, stiffness. This patient's chief complaints consist of pain, stiffness, altered gait pattern, leading to the following functional deficits: difficulty with ambulation and weight bearing activities. Relevant past medical history and comorbidities include borderline diabetes, obesity, history of cecerian section    Limitations  Lifting;Standing;Walking;House hold activities    How long can you sit comfortably?  2 hours    How long can you stand comfortably?  5 min    How long can you walk comfortably?  0 min    Diagnostic tests  prior to surgery (see chart)    Patient Stated Goals  get back to work, be able to stand and walk    Currently in Pain?  Yes    Pain Score  1     Pain Location  Knee    Pain Orientation  Right;Lateral    Pain Descriptors / Indicators  Sore    Pain Type  Surgical pain    Pain Onset  1 to 4 weeks ago        OBJECTIVE: Following manual:  - R knee flexion 115 degrees. - R knee extension lacking 5 degrees.   TREATMENT: Denies sensitivity to latex Denies long term steroid use Denies spinal surgery  Therapeutic exercise:to centralize symptoms and improve ROM and strength required for successful completion of functional activities. -Recumbent Bikewith no added resistance. For improved lower extremity ROM, muscular endurance, and activity tolerance; and to induce the analgesic effect of aerobic exercise, stimulate joint nutrition, and prepare body structures and systems for following interventions.Gradually moving up seat to improve  flexion ROM.Seat position 5  x 5 min, progressing to seat position 4 x 2 min. Declined to move closer. Subjective exam performed during biking.  - supine isometric hamstring curls with heels on theraball, cued for 10 second hold to get 5 sec hold, for 3 min to allow pt to focus on counting hold. To strengthen hamstrings and encourage reciprocal inhibition to reduce pressure on knee with flexion.  - supine R quad set with foam roll under ankles, 5 second hold, x2x10 with manual facilitation. Pt struggles with quad activation.  - Straight leg raise with quad set and foam roll under ankles, 2x 10 each side with cuing for proper quad activation. Contralateral ankle on foam roll to help stretch into extension.  - short arc quad over foam roller with 7.5# weights,5 second holds for 3 min. and time for rest and transitions.  - medium arc quad over round bolster with 9.5# weights,5 second holds for 3 min. and time for rest and transitions.  - long arc quad with 9.5# weights,5 second holds, x 3 min and time for rest and transitions.   Manual therapy: to reduce pain and tissue tension, improve range of motion, neuromodulation, in order to promote improved ability to complete functional activities. - right knee tibeofemoral AP, PA, and rotational motion each way, and distractoin grade II-III in seated position with knee flexed and off side of bed, hooklying position.  - right flexion ROM with overpressure, 5 sec with and without end range oscillations, 2x10.  - right knee extension with overpressure, 5 with osscilations, ankle propped up, x 20.  - supine right hip flexor stretch with leg off edge of table, pt holding contralateral knee, therapist overpressure on right distal femur towards hip extension, contract-relax technique x 4.   HOME EXERCISE PROGRAM Access Code: [email or web address]  URL: [email or web address]  Date: 05/18/2018  Prepared by: Norton Blizzard   Exercises   Supine Heel Slide  with Strap - 10-15 reps - 5 second hold - 2-3 Sets - 2x daily - 7x weekly   Hooklying Straight Leg Raise - 10-15 reps - 1 second hold - 3 Sets - 1x daily - 3x weekly   Supine Hamstring Stretch with Strap - 3 reps - 30 second hold - 1 Sets - 1x daily - 3x weekly   Clamshell with Resistance - 10-15 reps - 1 second hold - 3 Sets - 1x daily - 3x weekly   Prone Terminal Knee Extension - 10-15 reps - 1 second hold - 3 Sets - 1x daily - 3x weekly   Supine Bridge with Resistance Band - 10-15 reps - 1 second hold - 3 Sets - 1x daily - 3x weekly    Patient response to treatment:  Pt tolerated treatment well. Pt  was able to complete all exercisesand manual with some difficulty due to discomfort and mild increase in overall soreness. Pt advised to apply ice at home. Pt was able to increase PROM after manual intervention. She continues to demo poor quad activation at end range, and today's treatment focused on strongly on quad activation and strengthening and range of motion. Pt required cuing for proper technique and to facilitate improved neuromuscular control, strength, range of motion, and functional ability.Patient is making progress towards goals at this point.   PT Education - 05/24/18 1038    Education Details  exercise form and purpose, advice for self management    Person(s) Educated  Patient    Methods  Explanation;Demonstration;Tactile cues    Comprehension  Verbalized understanding;Returned demonstration       PT Short Term Goals - 05/17/18 0736      PT SHORT TERM GOAL #1   Title  Be independent with home exercise program completed at least 3 times per week for self-management of symptoms.    Baseline  Initial HEP provided at initial eval (05/04/2018);     Time  2    Period  Weeks    Status  Achieved    Target Date  05/18/18        PT Long Term Goals - 05/04/18 1507      PT LONG TERM GOAL #1   Title  Be independent with a long-term home exercise program for  self-management of symptoms.     Baseline  Initial HEP provided at IE (05/04/2018);    Time  6    Period  Weeks    Status  New    Target Date  06/15/18      PT LONG TERM GOAL #2   Title  Patient will demonstrate improved ability to perform functional tasks as exhibited by at least 10 point improvement in FOTO score    Baseline  Initial 44 (05/04/2018);     Time  6    Period  Weeks    Status  New    Target Date  06/15/18      PT LONG TERM GOAL #3   Title  Patient will increase BLE gross strength to 4+/5 as to improve functional strength for independent gait, increased standing tolerance and increased ADL ability.    Baseline  see objective exam    Time  6    Period  Weeks    Status  New    Target Date  06/15/18      PT LONG TERM GOAL #4   Title  Have right knee AROM equal or greater than 0-125 with no increase in pain except intermittent end range discomfort to allow patient to complete valued activities with less difficulty.     Baseline  -17-0-86 (ext- neutral- flex) (05/04/2018);    Time  6    Period  Weeks    Status  New    Target Date  06/15/18      PT LONG TERM GOAL #5   Title  Complete community, work and/or recreational activities without limitation due to current condition.     Baseline  unable to work, unable to keep grandchildren (05/04/2018);     Time  6    Period  Weeks    Status  New    Target Date  06/15/18            Plan - 05/24/18 1350    Clinical Impression Statement  Pt tolerated treatment well. Pt  was able to complete all exercisesand manual with some difficulty due to discomfort and mild increase in overall soreness. Pt advised to apply ice at home. Pt was able to increase PROM after manual intervention. She continues to demo poor quad activation at end range, and today's treatment focused on strongly on quad activation and strengthening and range of motion. Pt required cuing for proper technique and to facilitate improved neuromuscular control,  strength, range of motion, and functional ability.Patient is making progress towards goals at this point.    Rehab Potential  Good    Clinical Impairments Affecting Rehab Potential  (+) motivation, goal to return to work; (-) chronicity of symptoms, history of failed PT    PT Frequency  2x / week    PT Duration  6 weeks    PT Treatment/Interventions  ADLs/Self Care Home Management;Cryotherapy;Aquatic Therapy;Electrical Stimulation;Moist Heat;Gait training;Stair training;Functional mobility training;Therapeutic activities;Therapeutic exercise;Balance training;Neuromuscular re-education;Patient/family education;Manual techniques;Scar mobilization;Passive range of motion;Dry needling;Taping;Spinal Manipulations;Joint Manipulations;Other (comment)   joint moblization grade I-V   PT Next Visit Plan  assess response to last session and progress/regress exercises and upadate HEP as appropriate. Consider progressing to CKC next visit.     PT Home Exercise Plan  Medbridge Access Code: [email or web address]     Consulted and Agree with Plan of Care  Patient       Patient will benefit from skilled therapeutic intervention in order to improve the following deficits and impairments:  Abnormal gait, Decreased endurance, Decreased mobility, Difficulty walking, Decreased range of motion, Decreased scar mobility, Impaired perceived functional ability, Obesity, Decreased activity tolerance, Decreased strength, Impaired flexibility, Pain, Postural dysfunction  Visit Diagnosis: Chronic pain of right knee  Difficulty in walking, not elsewhere classified     Problem List Patient Active Problem List   Diagnosis Date Noted  . Borderline diabetes 04/25/2014  . Obesity (BMI 35.0-39.9 without comorbidity) 04/25/2014  . Pure hypercholesterolemia 04/25/2014    Cira Rue, PT, DPT 05/24/2018, 1:51 PM  [place] [facility] PHYSICAL AND SPORTS MEDICINE [address], [place],  [address] Phone: [phone]   Fax:  [phone]  Name: Mariah Sutton MRN: [id number] Date of Birth: 03/28/1961

## 2018-05-30 ENCOUNTER — Ambulatory Visit: Payer: No Typology Code available for payment source | Admitting: Physical Therapy

## 2018-05-30 DIAGNOSIS — M25561 Pain in right knee: Principal | ICD-10-CM

## 2018-05-30 DIAGNOSIS — G8929 Other chronic pain: Secondary | ICD-10-CM

## 2018-05-30 DIAGNOSIS — R030 Elevated blood-pressure reading, without diagnosis of hypertension: Secondary | ICD-10-CM | POA: Insufficient documentation

## 2018-05-30 DIAGNOSIS — R262 Difficulty in walking, not elsewhere classified: Secondary | ICD-10-CM

## 2018-05-30 NOTE — Therapy (Signed)
[facility] PH[facility]ICINE [address], [place], [address] Phone: [phone]   Fax:  [phone]  Physical Therapy Treatment  Patient Details  Name: Mariah Sutton MRN: [id number] Date of Birth: 11-Feb-1961 Referring Provider (PT):  [name], [place]   Encounter Date: 05/30/2018  PT End of Session - 05/30/18 1616    Visit Number  6    Number of Visits  13    Date for PT Re-Evaluation  06/15/18    Authorization Type  [employer]    Authorization Time Period  Current cert period: 05/04/2018 - 06/15/2018 (last PN: IE 05/04/2018);    Authorization - Visit Number  6    Authorization - Number of Visits  10    PT Start Time  1420    PT Stop Time  1505    PT Time Calculation (min)  45 min    Activity Tolerance  Patient tolerated treatment well;Patient limited by pain    Behavior During Therapy  WFL for tasks assessed/performed       Past Medical History:  Diagnosis Date  . Bronchitis   . GERD (gastroesophageal reflux disease)   . High cholesterol   . Knee pain    Right  . Pneumonia   . Pre-diabetes     Past Surgical History:  Procedure Laterality Date  . BREAST CYST ASPIRATION Bilateral 2000  . CESAREAN SECTION    . KNEE ARTHROSCOPY WITH MEDIAL MENISECTOMY Right 04/13/2018   Procedure: KNEE ARTHROSCOPY WITH MEDIAL AND LATERAL MENISECTOMY;  Surgeon: Kennedy Bucker, MD;  Location: [facility] ORS;  Service: Orthopedics;  Laterality: Right;  . SYNOVECTOMY  04/13/2018   Procedure: Partial SYNOVECTOMY;  Surgeon: Kennedy Bucker, MD;  Location: [facility] OR[facility];  Service: Orthopedics;;    There were no vitals filed for this visit.  Subjective Assessment - 05/30/18 1510    Subjective  Patient reports her knee is a dull 3/10 pain around the right knee. She states she was sore for a couple of days at her last treatment session. She states she hsa been trying to stretch her knee into flexion more but does not feel she has been very  successful.     Pertinent History  Patient is a 57 y.o. female who presents to outpatient physical therapy with a referral for  s/p R knee arthroscopy, stiffness. This patient's chief complaints consist of pain, stiffness, altered gait pattern, leading to the following functional deficits: difficulty with ambulation and weight bearing activities. Relevant past medical history and comorbidities include borderline diabetes, obesity, history of cecerian section    Limitations  Lifting;Standing;Walking;House hold activities    How long can you sit comfortably?  2 hours    How long can you stand comfortably?  5 min    How long can you walk comfortably?  0 min    Diagnostic tests  prior to surgery (see chart)    Patient Stated Goals  get back to work, be able to stand and walk    Currently in Pain?  Yes    Pain Score  3     Pain Location  Knee    Pain Orientation  Right    Pain Type  Surgical pain    Pain Onset  1 to 4 weeks ago        OBJECTIVE: Following manual:  - R knee flexion 117degrees (last measured 05/30/2018) - R knee extension lacking 5 degrees (last measured 05/24/2018)  TREATMENT: Denies sensitivity to latex Denies  long term steroid use Denies spinal surgery  Therapeutic exercise:to centralize symptoms and improve ROM and strength required for successful completion of functional activities. - NuStep level 3 using bilateral upper and lower extremities. Seated setting 6. For improved extremity mobility, muscular endurance, and activity tolerance; and to induce the analgesic effect of aerobic exercise, stimulate improved joint nutrition, and prepare body structures and systems for following interventions. x 6 min  Minutes during subjective exam.  - supine isometric hamstring curls with heels on theraball, cued for 10 second hold to get 5 sec hold, for 3 min to allow pt to focus on counting hold. To strengthen hamstrings and encourage reciprocal inhibition to reduce pressure  on knee with flexion.  - standing TKE with R quad against ball on wall, 5 second hold, x 3 min. Pt with improved quad activation in standing.  -longarc quad on omega machine focusing on right side lower to end range stretch and double leg extension,5 second holds, x 15 reps.  - hip hikes off edge of stair with unilateral UE extremity, x10 right leg off edge, 2x5 left leg of edge.  -Education on HEP including handout   Therapeutic activities: for functional strengthening and improved functional activity tolerance. - squat with BUE support cuing to deepest bend possible, attempting to touch buttocks on 12 inch stool. X 15 to stretch and strengthen quads. - squat with BUE support with TRX with goal to touch buttocks on 8 inch step x 10. Pt demo best glute and quad activation and knee flexion range compared to other positions.   Manual therapy: to reduce pain and tissue tension, improve range of motion, neuromodulation, in order to promote improved ability to complete functional activities. - right knee tibeofemoral AP, PA, and rotational motion each way, and distractoin grade II-IV in seated position with knee flexed hooklying position with foot anchored. - right flexion ROM with overpressure, 5 sec with and without end range oscillations, x10.   - right flexion ROM with overpressure with clinician forearm behind knee to stretch joint capsule.  - supine right hip flexor stretch with leg off edge of table, pt holding contralateral knee, therapist overpressure on right distal femur towards hip extension, contract-relax technique x 4.  - STM with foam roll over right quads.   HOME EXERCISE PROGRAM Access Code: [email or web address]  URL: [email or web address]  Date: 05/30/2018  Prepared by: Norton Blizzard   Exercises  Supine Heel Slide with Strap - 10-15 reps - 5 second hold - 2-3 Sets - 2x daily - 7x weekly  Standing Terminal Knee Extension at Wall with Ball - 10-15 reps - 5 second hold - 2  Sets - 1x daily - 7x weekly  Hooklying Straight Leg Raise - 10-15 reps - 1 second hold - 3 Sets - 1x daily - 3x weekly  Supine Hamstring Stretch with Strap - 3 reps - 30 second hold - 1 Sets - 1x daily - 3x weekly  Clamshell with Resistance - 10-15 reps - 1 second hold - 3 Sets - 1x daily - 3x weekly  Prone Terminal Knee Extension - 10-15 reps - 1 second hold - 3 Sets - 1x daily - 3x weekly  Supine Bridge with Resistance Band - 10-15 reps - 1 second hold - 3 Sets - 1x daily - 3x weekly  Standing Hip Hiking - 10-15 reps - 1 second hold - 3 Sets - 1x daily - 3x weekly  Patient response to treatment:  Pt tolerated treatment well. Pt was able to  complete all exercisesand manual with some difficulty due to discomfort and mild increase in overall soreness. Pt was able to increase PROM after manual intervention compared to at start of visit but minimal progress since last visit and minimal carry over. Pt was able to progress to weight bearing exercises with good tolerance. She continues to demo poor quad activation at end range but improved in standing position. Today's treatment focused on strongly on quad activation and strengthening hip and range of motion. Pt required cuing for proper technique and to facilitate improved neuromuscular control, strength, range of motion, and functional ability.Patient is making progress towards goals at this point.    PT Short Term Goals - 05/17/18 0736      PT SHORT TERM GOAL #1   Title  Be independent with home exercise program completed at least 3 times per week for self-management of symptoms.    Baseline  Initial HEP provided at initial eval (05/04/2018);     Time  2    Period  Weeks    Status  Achieved    Target Date  05/18/18        PT Long Term Goals - 05/04/18 1507      PT LONG TERM GOAL #1   Title  Be independent with a long-term home exercise program for self-management of symptoms.     Baseline  Initial HEP provided at IE (05/04/2018);     Time  6    Period  Weeks    Status  New    Target Date  06/15/18      PT LONG TERM GOAL #2   Title  Patient will demonstrate improved ability to perform functional tasks as exhibited by at least 10 point improvement in FOTO score    Baseline  Initial 44 (05/04/2018);     Time  6    Period  Weeks    Status  New    Target Date  06/15/18      PT LONG TERM GOAL #3   Title  Patient will increase BLE gross strength to 4+/5 as to improve functional strength for independent gait, increased standing tolerance and increased ADL ability.    Baseline  see objective exam    Time  6    Period  Weeks    Status  New    Target Date  06/15/18      PT LONG TERM GOAL #4   Title  Have right knee AROM equal or greater than 0-125 with no increase in pain except intermittent end range discomfort to allow patient to complete valued activities with less difficulty.     Baseline  -17-0-86 (ext- neutral- flex) (05/04/2018);    Time  6    Period  Weeks    Status  New    Target Date  06/15/18      PT LONG TERM GOAL #5   Title  Complete community, work and/or recreational activities without limitation due to current condition.     Baseline  unable to work, unable to keep grandchildren (05/04/2018);     Time  6    Period  Weeks    Status  New    Target Date  06/15/18            Plan - 05/30/18 1614    Clinical Impression Statement  Pt tolerated treatment well. Pt was able to complete all exercisesand manual with some difficulty due to discomfort and mild increase in overall soreness. Pt was able to  increase PROM after manual intervention compared to at start of visit but minimal progress since last visit and minimal carry over. Pt was able to progress to weight bearing exercises with good tolerance. She continues to demo poor quad activation at end range but improved in standing position. Today's treatment focused on strongly on quad activation and strengthening hip and range of motion. Pt required  cuing for proper technique and to facilitate improved neuromuscular control, strength, range of motion, and functional ability.Patient is making progress towards goals at this point.    Rehab Potential  Good    Clinical Impairments Affecting Rehab Potential  (+) motivation, goal to return to work; (-) chronicity of symptoms, history of failed PT    PT Frequency  2x / week    PT Duration  6 weeks    PT Treatment/Interventions  ADLs/Self Care Home Management;Cryotherapy;Aquatic Therapy;Electrical Stimulation;Moist Heat;Gait training;Stair training;Functional mobility training;Therapeutic activities;Therapeutic exercise;Balance training;Neuromuscular re-education;Patient/family education;Manual techniques;Scar mobilization;Passive range of motion;Dry needling;Taping;Spinal Manipulations;Joint Manipulations;Other (comment)   joint moblization grade I-V   PT Next Visit Plan  assess response to last session and progress/regress exercises and upadate HEP as appropriate. continue functional strengthening as tolerated.     PT Home Exercise Plan  Medbridge Access Code: [email or web address]     Consulted and Agree with Plan of Care  Patient       Patient will benefit from skilled therapeutic intervention in order to improve the following deficits and impairments:  Abnormal gait, Decreased endurance, Decreased mobility, Difficulty walking, Decreased range of motion, Decreased scar mobility, Impaired perceived functional ability, Obesity, Decreased activity tolerance, Decreased strength, Impaired flexibility, Pain, Postural dysfunction  Visit Diagnosis: Chronic pain of right knee  Difficulty in walking, not elsewhere classified     Problem List Patient Active Problem List   Diagnosis Date Noted  . Prehypertension 05/30/2018  . Borderline diabetes 04/25/2014  . Obesity (BMI 35.0-39.9 without comorbidity) 04/25/2014  . Pure hypercholesterolemia 04/25/2014    Cira RueSara R Yuktha Kerchner, PT, DPT 05/30/2018, 4:17 PM  [facility] [address] [place], [place], [id number] Phone: [phone]   Fax:  [phone]  Name: Geri SeminoleLisa A Faye MRN: [id number] Date of Birth: Dec 24, 1960

## 2018-06-01 ENCOUNTER — Ambulatory Visit: Payer: No Typology Code available for payment source | Admitting: Physical Therapy

## 2018-06-06 ENCOUNTER — Ambulatory Visit: Payer: No Typology Code available for payment source | Admitting: Physical Therapy

## 2018-06-06 DIAGNOSIS — M25561 Pain in right knee: Secondary | ICD-10-CM | POA: Diagnosis not present

## 2018-06-06 DIAGNOSIS — R262 Difficulty in walking, not elsewhere classified: Secondary | ICD-10-CM

## 2018-06-06 DIAGNOSIS — G8929 Other chronic pain: Secondary | ICD-10-CM

## 2018-06-06 NOTE — Therapy (Signed)
[facility] [address], [place], [address] Phone: [phone]   Fax:  [phone]  Physical Therapy Treatment / Progress Note / Re-Certification Reporting period: 05/04/2018 - 06/06/2018  Patient Details  Name: Mariah Sutton MRN: [id number] Date of Birth: [birth year] Referring Provider (PT):  Feliberto Gottron, [place]   Encounter Date: 06/06/2018  PT End of Session - 06/06/18 1043    Visit Number  7    Number of Visits  19    Date for PT Re-Evaluation  07/18/18    Authorization Type  MEDCOST    Authorization Time Period  Current cert period: 70/96/2836 - 07/18/2018 (last PN: 06/06/2018);    Authorization - Visit Number  1    Authorization - Number of Visits  10    PT Start Time  1030    PT Stop Time  1115    PT Time Calculation (min)  45 min    Activity Tolerance  Patient tolerated treatment well;Patient limited by pain    Behavior During Therapy  WFL for tasks assessed/performed       Past Medical History:  Diagnosis Date  . Bronchitis   . GERD (gastroesophageal reflux disease)   . High cholesterol   . Knee pain    Right  . Pneumonia   . Pre-diabetes     Past Surgical History:  Procedure Laterality Date  . BREAST CYST ASPIRATION Bilateral 2000  . CESAREAN SECTION    . KNEE ARTHROSCOPY WITH MEDIAL MENISECTOMY Right 04/13/2018   Procedure: KNEE ARTHROSCOPY WITH MEDIAL AND LATERAL MENISECTOMY;  Surgeon: Hessie Knows, MD;  Location: [facility];  Service: Orthopedics;  Laterality: Right;  . SYNOVECTOMY  04/13/2018   Procedure: Partial SYNOVECTOMY;  Surgeon: Hessie Knows, MD;  Location: [facility];  Service: Orthopedics;;    There were no vitals filed for this visit.  Subjective Assessment - 06/06/18 1034    Subjective  Patient reports 1/10 pain "barely any" upon arrival. It has been up to 9/10 in the last couple of days that she partially attributes to the changes in weather. She has not been  feeling well the last couple of days after eating something that did not agree with her. She is feeling better from that now. She well be seeing her referring doctor on Friday to discuss return to work.  She reports the clam shell exercise is getting easier for her.  She has been completing her HEP as prescribed. She finds the TKE challenging. Steps are still kind of a problem. She went to her son's house a week ago and had difficulty walking up and down the hill.  Sometimes she feels like her knee is getting better, other times she gets frustrated and feels like it is not getting better.  Overall she states she thought her recovery would have been much easier. She feels she is working very hard at improving her ROM and staying active but she has not gotten the progress she expected.     Pertinent History  Patient is a 57 y.o. female who presents to outpatient physical therapy with a referral for s/p R knee arthroscopy, stiffness. This patient's chief complaints consist of pain, stiffness, altered gait pattern, leading to the following functional deficits: difficulty with ambulation and weight bearing activities. Relevant past medical history and comorbidities include borderline diabetes, obesity, history of cecerian section    Limitations  Lifting;Standing;Walking;House hold activities    How long can you sit comfortably?  no  limitation    How long can you stand comfortably?  20-30 min    How long can you walk comfortably?  15-20 min    Diagnostic tests  prior to surgery (see chart)    Patient Stated Goals  get back to work, be able to stand and walk    Currently in Pain?  Yes    Pain Score  1     Pain Location  Knee    Pain Orientation  Right    Pain Descriptors / Indicators  Sore    Pain Onset  More than a month ago    Pain Frequency  Intermittent    Aggravating Factors   walking, weight bearing, staires, getting up and down from chair, navigating hils    Pain Relieving Factors  elevation     Effect of Pain on Daily Activities  Current level of function: unable to work, difficulty with all weight bearing activity, has plans to help take care of her grandkids in the next couple of weeks.          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 May try to return on 4 weeks.     Leisure  grandkids (go to park, baking and making crafts around the house).       Cognition   Overall Cognitive Status  Within Functional Limits for tasks assessed      Observation/Other Assessments   Observations  see note from 06/06/2018 for latest objective data    Focus on Therapeutic Outcomes (FOTO)   35        OBJECTIVE: OBSERVATION/INSPECTION: Patient presents with closed insicion sites with no excessive redness, swelling, or heat.   PERIPHERAL JOINT MOTION (AROM/PROM in degrees):  *Indicates pain   Hip grossly WFL bilaterally, except loss of extension. Knee  Flexion: R = 105/110* firm, L = 122/125 firm end feel.   Extension: R = -10*, L = 10. PROM Following manual:  -R knee flexion 112degrees (last measured 06/06/2018) - R knee extension lacking 5 degrees (last measured 06/06/2018)  STRENGTH:  *Indicates pain Hip         Flexion: R = 5/5, L = 5/5.  Extension: R = 4+/5, L = 4+/5.  Abduction: R = 3+/5, L = 45/5. Knee  Ext: R = 3+/5**, L = 5/5.  Flex: R = 4+/5, L = 4/5. Ankle (seated position) grossly WNL and equal bilaterally   PALPATION:  TTP anterior, medial, and lateral joint line and medial right knee/distal thigh..  FUNCTIONAL MOBILITY:  Bed mobility: supine <> sit I.  Transfers: sit <> stand I   Gait: ambulates with wide stance, antalgic gait favoring right LE, lacks R knee flexion and extension.   Stairs: four 6-inch steps, ascends step over step with touchdown UE support,  descends step together pattern leading with left (more painful leading with right) and U UE support.        TREATMENT: Denies sensitivity to latex Denies long term steroid use Denies spinal surgery  Therapeutic exercise:to centralize symptoms and improve ROM and strength required for successful completion of functional activities. -NuStep level4using bilateral upper and lower extremities. Seated setting 6. For improved extremity mobility, muscular endurance, and activity tolerance; and to induce the analgesic effect of aerobic exercise, stimulate improved joint  nutrition, and prepare body structures and systems for following interventions. x6 minMinutes during subjective exam. - hip hikes off edge of stair with unilateral UE extremity, 2x15 each side.  - standing lunge to top of 12 inch step flexing knee to end range and returning, 2x10 each side. BUE support as needed. To improve quad strength and knee flexion ROM.  - Measurements to assess progress (see above).  - Education on diagnosis, prognosis, POC, anatomy and physiology of current condition.  - squat with BUE support cuing to deepest bend possible, attempting to touch buttocks on 9 inch stp. X 15, x10 x 5 to stretch and strengthen quads. Attempted to use floor slider under left foot to increase loading on right LE, but pt had difficulty coordinating exercise.  Manual therapy:to reduce pain and tissue tension, improve range of motion, neuromodulation, in order to promote improved ability to complete functional activities. - right knee tibeofemoral AP, PA, and rotational motion each way, and distractoin grade II-IV in seated position with knee flexed hooklying position with foot anchored. - right flexion ROM with overpressure, 5 sec with and without end range oscillations, x10.   - right flexion ROM with overpressure with clinician forearm behind knee to stretch joint capsule.  - STM with foam roll over right quads.    HOME EXERCISE PROGRAM Access Code: [email or web address]  URL: [email or web address]  Date: 05/30/2018  Prepared by: Rosita Kea   Exercises   Supine Heel Slide with Strap - 10-15 reps - 5 second hold - 2-3 Sets - 2x daily - 7x weekly   Standing Terminal Knee Extension at Wall with Ball - 10-15 reps - 5 second hold - 2 Sets - 1x daily - 7x weekly   Hooklying Straight Leg Raise - 10-15 reps - 1 second hold - 3 Sets - 1x daily - 3x weekly   Supine Hamstring Stretch with Strap - 3 reps - 30 second hold - 1 Sets - 1x daily - 3x weekly   Clamshell with Resistance - 10-15 reps - 1 second hold - 3 Sets - 1x daily - 3x weekly   Prone Terminal Knee Extension - 10-15 reps - 1 second hold - 3 Sets - 1x daily - 3x weekly   Supine Bridge with Resistance Band - 10-15 reps - 1 second hold - 3 Sets - 1x daily - 3x weekly   Standing Hip Hiking - 10-15 reps - 1 second hold - 3 Sets - 1x daily - 3x weekly  Patient response to treatment:  Pt tolerated treatment well. Pt was able to complete all exercisesand manual with some difficulty due to discomfort but reported minimal pain by end of session. Pt was able to increase PROM after manual intervention compared to at start of visit but unable to surpass greatest range measured at last visit. Pt was able to cotninue weight bearing exercises with good tolerance. Shecontinues to demo poor quad activation at end range but this is improved in standing position.Pt required cuing for proper technique and to facilitate improved neuromuscular control, strength, range of motion, and functional ability.  PT Education - 06/06/18 1133    Education Details  exercise form/purpose. advice for self management and building up standing tolerance to be able to return to work.  POC, progress.     Person(s) Educated  Patient    Methods  Explanation;Demonstration;Tactile cues;Verbal cues    Comprehension  Verbalized understanding;Returned demonstration       PT  Short Term Goals - 05/17/18 5732  PT SHORT TERM GOAL #1   Title  Be independent with home exercise program completed at least 3 times per week for self-management of symptoms.    Baseline  Initial HEP provided at initial eval (05/04/2018);     Time  2    Period  Weeks    Status  Achieved    Target Date  05/18/18        PT Long Term Goals - 06/06/18 1138      PT LONG TERM GOAL #1   Title  Be independent with a long-term home exercise program for self-management of symptoms.     Baseline  Initial HEP provided at IE (05/04/2018); is independent with current HEP but has not yet received long term HEP (06/06/2018);    Time  6    Period  Weeks    Status  Partially Met    Target Date  07/18/18      PT LONG TERM GOAL #2   Title  Patient will demonstrate improved ability to perform functional tasks as exhibited by at least 10 point improvement in FOTO score    Baseline  Initial 44 (05/04/2018); 35 (06/06/2018);     Time  6    Period  Weeks    Status  On-going    Target Date  07/18/18      PT LONG TERM GOAL #3   Title  Patient will increase BLE gross strength to 4+/5 as to improve functional strength for independent gait, increased standing tolerance and increased ADL ability.    Baseline  see objective exam    Time  6    Period  Weeks    Status  Partially Met    Target Date  07/18/18      PT LONG TERM GOAL #4   Title  Have right knee AROM equal or greater than 0-125 with no increase in pain except intermittent end range discomfort to allow patient to complete valued activities with less difficulty.     Baseline  -17-0-86 (ext- neutral- flex) (05/04/2018); -10-0-105 (06/06/2018)    Time  6    Period  Weeks    Status  Partially Met    Target Date  07/18/18      PT LONG TERM GOAL #5   Title  Complete community, work and/or recreational activities without limitation due to current condition.     Baseline  unable to work, unable to keep grandchildren (05/04/2018); has not  returned to work, will be helping with grandchildren over the next two weeks.  (06/06/2018)    Time  6    Period  Weeks    Status  Partially Met    Target Date  07/18/18            Plan - 06/06/18 1147    Clinical Impression Statement  Patient has attended 7 physical therapy treatment sessions this episode of care. She has made steady but slow progress towards most of her goals. Subjectively, she reports continued frustration with ongoing pain and limitations but often reports very low numeric pain rating numbers. She states she feels unready to go back to work and her [facility] has actually decreased since her initial eval, reflecting decreased self-reported function. Objectively, she has improved in strength, activity tolerance, and range of motion. She has started weight bearing rehab activities with good tolerance at this point. She continues to lack activity tolerance, range of motion, and strength and continues to be limited by pain. She has not yet returned  to work and continues to have difficulty with stairs, walking, weight bearing, transferring, walking up and down hills, and caring for her grandchildren. Patient would benefit from continued skilled physical therapy intervention to continue addressing her remaining impairments and functional limitations and working towards stated goals.     Rehab Potential  Good    Clinical Impairments Affecting Rehab Potential  (+) motivation, goal to return to work; (-) chronicity of symptoms, history of failed PT    PT Frequency  2x / week    PT Duration  6 weeks    PT Treatment/Interventions  ADLs/Self Care Home Management;Cryotherapy;Aquatic Therapy;Electrical Stimulation;Moist Heat;Gait training;Stair training;Functional mobility training;Therapeutic activities;Therapeutic exercise;Balance training;Neuromuscular re-education;Patient/family education;Manual techniques;Scar mobilization;Passive range of motion;Dry needling;Taping;Spinal  Manipulations;Joint Manipulations;Other (comment)   joint moblization grade I-V   PT Next Visit Plan  assess response to last session and progress/regress exercises and upadate HEP as appropriate. continue functional strengthening as tolerated.     PT Home Exercise Plan  Medbridge Access Code: [email or web address]     Consulted and Agree with Plan of Care  Patient        Patient will benefit from skilled therapeutic intervention in order to improve the following deficits and impairments:  Abnormal gait, Decreased endurance, Decreased mobility, Difficulty walking, Decreased range of motion, Decreased scar mobility, Impaired perceived functional ability, Obesity, Decreased activity tolerance, Decreased strength, Impaired flexibility, Pain, Postural dysfunction  Visit Diagnosis: Chronic pain of right knee  Difficulty in walking, not elsewhere classified     Problem List Patient Active Problem List   Diagnosis Date Noted  . Prehypertension 05/30/2018  . Borderline diabetes 04/25/2014  . Obesity (BMI 35.0-39.9 without comorbidity) 04/25/2014  . Pure hypercholesterolemia 04/25/2014    Nancy Nordmann, PT, DPT 06/06/2018, 11:49 AM  [facility] [address], [place], [address] Phone: [phone]   Fax:  [phone]  Name: Mariah Sutton MRN: [id number] Date of Birth: 07-Oct-1960

## 2018-06-08 ENCOUNTER — Encounter: Payer: Self-pay | Admitting: Physical Therapy

## 2018-06-08 ENCOUNTER — Ambulatory Visit: Payer: No Typology Code available for payment source | Admitting: Physical Therapy

## 2018-06-08 DIAGNOSIS — M25561 Pain in right knee: Secondary | ICD-10-CM | POA: Diagnosis not present

## 2018-06-08 DIAGNOSIS — G8929 Other chronic pain: Secondary | ICD-10-CM

## 2018-06-08 DIAGNOSIS — R262 Difficulty in walking, not elsewhere classified: Secondary | ICD-10-CM

## 2018-06-08 NOTE — Therapy (Signed)
[facility] [address] [place], [address] Phone: [phone]   Fax:  [phone]  Physical Therapy Treatment  Patient Details  Name: Mariah Sutton MRN: [id number] Date of Birth: Jun 17, 1960 Referring Provider (PT):  Feliberto Gottron, [place]   Encounter Date: 06/08/2018  PT End of Session - 06/08/18 2113    Visit Number  8    Number of Visits  19    Date for PT Re-Evaluation  07/18/18    Authorization Type  [employer]    Authorization Time Period  Current cert period: 66/29/4765 - 07/18/2018 (last PN: 06/06/2018);    Authorization - Visit Number  2    Authorization - Number of Visits  10    PT Start Time  4650    PT Stop Time  1430    PT Time Calculation (min)  45 min    Activity Tolerance  Patient tolerated treatment well    Behavior During Therapy  WFL for tasks assessed/performed       Past Medical History:  Diagnosis Date  . Bronchitis   . GERD (gastroesophageal reflux disease)   . High cholesterol   . Knee pain    Right  . Pneumonia   . Pre-diabetes     Past Surgical History:  Procedure Laterality Date  . BREAST CYST ASPIRATION Bilateral 2000  . CESAREAN SECTION    . KNEE ARTHROSCOPY WITH MEDIAL MENISECTOMY Right 04/13/2018   Procedure: KNEE ARTHROSCOPY WITH MEDIAL AND LATERAL MENISECTOMY;  Surgeon: Hessie Knows, MD;  Location: [facility];  Service: Orthopedics;  Laterality: Right;  . SYNOVECTOMY  04/13/2018   Procedure: Partial SYNOVECTOMY;  Surgeon: Hessie Knows, MD;  Location: [facility];  Service: Orthopedics;;    There were no vitals filed for this visit.  Subjective Assessment - 06/08/18 2111    Subjective  Patient reports low pain (1/10) upon arrival. States she did not do as much with her HEP since last session (since the day in between was Christmas). Continues to feel somewhat dissapointed in her progress. Reports no unacceptable pain or soreness following last session.      Pertinent History  Patient is a 57 y.o. female who presents to outpatient physical therapy with a referral for s/p R knee arthroscopy, stiffness. This patient's chief complaints consist of pain, stiffness, altered gait pattern, leading to the following functional deficits: difficulty with ambulation and weight bearing activities. Relevant past medical history and comorbidities include borderline diabetes, obesity, history of cecerian section    Limitations  Lifting;Standing;Walking;House hold activities    How long can you sit comfortably?  no limitation    How long can you stand comfortably?  20-30 min    How long can you walk comfortably?  15-20 min    Diagnostic tests  prior to surgery (see chart)    Patient Stated Goals  get back to work, be able to stand and walk    Currently in Pain?  Yes    Pain Score  1     Pain Location  Knee    Pain Orientation  Right    Pain Descriptors / Indicators  Sore    Pain Type  Surgical pain    Pain Onset  More than a month ago    Pain Frequency  Intermittent       TREATMENT: Denies sensitivity to latex Denies long term steroid use Denies spinal surgery  Therapeutic exercise:to centralize symptoms and improve ROM and strength required for  successful completion of functional activities. -Recumbent bike., no added resistance, goal 50 RPM. Seat setting 5. For improved extremity mobility, muscular endurance, and activity tolerance; and to induce the analgesic effect of aerobic exercise, stimulate improved joint nutrition, and prepare body structures and systems for following interventions. x5 minduring subjective exam. - hip hikes off edge of stair with unilateral UE extremity, 2x15 each side.  - standing lunge to top of 12 inch step flexing knee to end range and returning, 2x15 R side only. Touch down UE support as needed. To improve quad strength and ROM.  - supine with right leg off edge of plinth and left leg in hooklying, hip flexion and  slow eccentric lower to end range hip extension with 5# ankle weight on right ankle, x 15 with cuing to keep knee flexed to improve hip flexor length and strength.  - Education on diagnosis, prognosis, POC, anatomy and physiology of current condition.   Therapeutic activities: for functional strengthening and improved functional activity tolerance. - lateral step up 4 inches with SBA-CGA for safety and attempting to minimize use of toe to propel upward. x15 each side. - leg press, concentric B LE, eccentric right LE dominant. 2 x 10 at 45#.  - squat with BUE support cuing to deepest bend possible, attempting to touch buttocks on 9 inch stp. X 15, x10 x 5 to stretch and strengthen quads. Attempted to use floor slider under left foot to increase loading on right LE, but pt had difficulty coordinating exercise. - split squat in doorway with back knee touchdown on foam roller, able to complete 15 with right leg back, stopped after 8 with left leg back due to intolerable pain in back knee.  Manual therapy:to reduce pain and tissue tension, improve range of motion, neuromodulation, in order to promote improved ability to complete functional activities. - right knee tibeofemoral AP, PA, and rotational motion each way, and distractoin grade II-IVin seated position with knee flexed hooklying position with foot anchored. - right flexion ROM with overpressure, 5 sec with and without end range oscillations, x10. - right flexion ROM with overpressure with clinician forearm behind knee to stretch joint capsule. - STM with foam roll over right quads.  HOME EXERCISE PROGRAM Access Code: [email or web address]  URL: [email or web address]  Date: 05/30/2018  Prepared by: Rosita Kea   Exercises   Supine Heel Slide with Strap - 10-15 reps - 5 second hold - 2-3 Sets - 2x daily - 7x weekly   Standing Terminal Knee Extension at Wall with Ball - 10-15 reps - 5 second hold - 2 Sets - 1x daily - 7x weekly    Hooklying Straight Leg Raise - 10-15 reps - 1 second hold - 3 Sets - 1x daily - 3x weekly   Supine Hamstring Stretch with Strap - 3 reps - 30 second hold - 1 Sets - 1x daily - 3x weekly   Clamshell with Resistance - 10-15 reps - 1 second hold - 3 Sets - 1x daily - 3x weekly   Prone Terminal Knee Extension - 10-15 reps - 1 second hold - 3 Sets - 1x daily - 3x weekly   Supine Bridge with Resistance Band - 10-15 reps - 1 second hold - 3 Sets - 1x daily - 3x weekly   Standing Hip Hiking - 10-15 reps - 1 second hold - 3 Sets - 1x daily - 3x weekly  Patient response to treatment:  Pt tolerated treatment well overall. Pt was able to complete all exercisesand  manual with some difficulty due to discomfort but reported no overall increase in pain by end of session. Pt is making minimal ROM gains at this point so functional quad strengthening was the focus of today's visit with activities that promote improved ROM included. Pt was able to cotninue weight bearing exercises with good tolerance.She demonstrates very poor single leg step down control suggesting poor quad strength and impaired hip strength/coordination and would benefit from further single leg strengthening. .Pt required cuing for proper technique and to facilitate improved neuromuscular control, strength, range of motion, and functional ability.Patient is making overall progress towards goals.   PT Education - 06/08/18 2113    Education Details  exericse form/purpose. advice for self-management    Person(s) Educated  Patient    Methods  Explanation;Demonstration;Tactile cues;Verbal cues    Comprehension  Verbalized understanding;Returned demonstration       PT Short Term Goals - 05/17/18 0736      PT SHORT TERM GOAL #1   Title  Be independent with home exercise program completed at least 3 times per week for self-management of symptoms.    Baseline  Initial HEP provided at initial eval (05/04/2018);     Time  2    Period  Weeks     Status  Achieved    Target Date  05/18/18        PT Long Term Goals - 06/06/18 1138      PT LONG TERM GOAL #1   Title  Be independent with a long-term home exercise program for self-management of symptoms.     Baseline  Initial HEP provided at IE (05/04/2018); is independent with current HEP but has not yet received long term HEP (06/06/2018);    Time  6    Period  Weeks    Status  Partially Met    Target Date  07/18/18      PT LONG TERM GOAL #2   Title  Patient will demonstrate improved ability to perform functional tasks as exhibited by at least 10 point improvement in FOTO score    Baseline  Initial 44 (05/04/2018); 35 (06/06/2018);     Time  6    Period  Weeks    Status  On-going    Target Date  07/18/18      PT LONG TERM GOAL #3   Title  Patient will increase BLE gross strength to 4+/5 as to improve functional strength for independent gait, increased standing tolerance and increased ADL ability.    Baseline  see objective exam    Time  6    Period  Weeks    Status  Partially Met    Target Date  07/18/18      PT LONG TERM GOAL #4   Title  Have right knee AROM equal or greater than 0-125 with no increase in pain except intermittent end range discomfort to allow patient to complete valued activities with less difficulty.     Baseline  -17-0-86 (ext- neutral- flex) (05/04/2018); -10-0-105 (06/06/2018)    Time  6    Period  Weeks    Status  Partially Met    Target Date  07/18/18      PT LONG TERM GOAL #5   Title  Complete community, work and/or recreational activities without limitation due to current condition.     Baseline  unable to work, unable to keep grandchildren (05/04/2018); has not returned to work, will be helping with grandchildren over the next two weeks.  (06/06/2018)  Time  6    Period  Weeks    Status  Partially Met    Target Date  07/18/18            Plan - 06/08/18 2122    Clinical Impression Statement  Pt tolerated treatment well  overall. Pt was able to complete all exercisesand manual with some difficulty due to discomfort but reported no overall increase in pain by end of session. Pt is making minimal ROM gains at this point so functional quad strengthening was the focus of today's visit with activities that promote improved ROM included. Pt was able to cotninue weight bearing exercises with good tolerance.She demonstrates very poor single leg step down control suggesting poor quad strength and impaired hip strength/coordination and would benefit from further single leg strengthening. .Pt required cuing for proper technique and to facilitate improved neuromuscular control, strength, range of motion, and functional ability.Patient is making overall progress towards goals.     Rehab Potential  Good    Clinical Impairments Affecting Rehab Potential  (+) motivation, goal to return to work; (-) chronicity of symptoms, history of failed PT    PT Frequency  2x / week    PT Duration  6 weeks    PT Treatment/Interventions  ADLs/Self Care Home Management;Cryotherapy;Aquatic Therapy;Electrical Stimulation;Moist Heat;Gait training;Stair training;Functional mobility training;Therapeutic activities;Therapeutic exercise;Balance training;Neuromuscular re-education;Patient/family education;Manual techniques;Scar mobilization;Passive range of motion;Dry needling;Taping;Spinal Manipulations;Joint Manipulations;Other (comment)   joint moblization grade I-V   PT Next Visit Plan  assess response to last session and progress/regress exercises and upadate HEP as appropriate. continue functional strengthening as tolerated.  Work toward standing tolerance required for return to work.     PT Home Exercise Plan  Medbridge Access Code: [email or web address]     Consulted and Agree with Plan of Care  Patient       Patient will benefit from skilled therapeutic intervention in order to improve the following deficits and impairments:  Abnormal gait, Decreased  endurance, Decreased mobility, Difficulty walking, Decreased range of motion, Decreased scar mobility, Impaired perceived functional ability, Obesity, Decreased activity tolerance, Decreased strength, Impaired flexibility, Pain, Postural dysfunction  Visit Diagnosis: Chronic pain of right knee  Difficulty in walking, not elsewhere classified     Problem List Patient Active Problem List   Diagnosis Date Noted  . Prehypertension 05/30/2018  . Borderline diabetes 04/25/2014  . Obesity (BMI 35.0-39.9 without comorbidity) 04/25/2014  . Pure hypercholesterolemia 04/25/2014    Nancy Nordmann, PT, DPT 06/08/2018, 9:24 PM  [facility] [address] [place], [address] Phone: [phone]   Fax:  [phone]  Name: Mariah Sutton MRN: [id number] Date of Birth: 05-03-1961

## 2018-06-12 ENCOUNTER — Encounter: Payer: Self-pay | Admitting: Physical Therapy

## 2018-06-12 ENCOUNTER — Ambulatory Visit: Payer: No Typology Code available for payment source

## 2018-06-12 DIAGNOSIS — G8929 Other chronic pain: Secondary | ICD-10-CM

## 2018-06-12 DIAGNOSIS — R262 Difficulty in walking, not elsewhere classified: Secondary | ICD-10-CM

## 2018-06-12 DIAGNOSIS — M25561 Pain in right knee: Principal | ICD-10-CM

## 2018-06-12 NOTE — Therapy (Signed)
[facility] [address], [place], [address] Phone: [phone]   Fax:  [phone]  Physical Therapy Treatment  Patient Details  Name: Mariah Sutton MRN: [id number] Date of Birth: 1960-07-26 Referring Provider (PT):  Feliberto Gottron, [place]   Encounter Date: 06/12/2018  PT End of Session - 06/12/18 1506    Visit Number  9    Number of Visits  19    Date for PT Re-Evaluation  07/18/18    Authorization Time Period  Current cert period: 69/62/9528 - 07/18/2018 (last PN: 06/06/2018);    Authorization - Visit Number  3    Authorization - Number of Visits  10    PT Start Time  4132    PT Stop Time  1512    PT Time Calculation (min)  39 min    Activity Tolerance  Patient tolerated treatment well;Patient limited by pain    Behavior During Therapy  WFL for tasks assessed/performed       Past Medical History:  Diagnosis Date  . Bronchitis   . GERD (gastroesophageal reflux disease)   . High cholesterol   . Knee pain    Right  . Pneumonia   . Pre-diabetes     Past Surgical History:  Procedure Laterality Date  . BREAST CYST ASPIRATION Bilateral 2000  . CESAREAN SECTION    . KNEE ARTHROSCOPY WITH MEDIAL MENISECTOMY Right 04/13/2018   Procedure: KNEE ARTHROSCOPY WITH MEDIAL AND LATERAL MENISECTOMY;  Surgeon: Hessie Knows, MD;  Location: [facility];  Service: Orthopedics;  Laterality: Right;  . SYNOVECTOMY  04/13/2018   Procedure: Partial SYNOVECTOMY;  Surgeon: Hessie Knows, MD;  Location: [facility];  Service: Orthopedics;;    There were no vitals filed for this visit.  Subjective Assessment - 06/12/18 1433    Subjective  Patient reported that she went back to work today, stood from 7-130pm. Her pain is increased due to this per her report. Also stated she had a cortisone shot from Dr. Rudene Christians on Friday.    Pertinent History  Patient is a 57 y.o. female who presents to outpatient physical therapy with a  referral for s/p R knee arthroscopy, stiffness. This patient's chief complaints consist of pain, stiffness, altered gait pattern, leading to the following functional deficits: difficulty with ambulation and weight bearing activities. Relevant past medical history and comorbidities include borderline diabetes, obesity, history of cecerian section    Limitations  Lifting;Standing;Walking;House hold activities    How long can you sit comfortably?  no limitation    How long can you stand comfortably?  20-30 min    How long can you walk comfortably?  15-20 min    Diagnostic tests  prior to surgery (see chart)    Patient Stated Goals  get back to work, be able to stand and walk    Currently in Pain?  Yes    Pain Score  7     Pain Location  Knee    Pain Orientation  Right;Anterior    Pain Descriptors / Indicators  Sore;Tender    Pain Type  Surgical pain    Pain Onset  More than a month ago       Therapeutic exercise: to centralize symptoms and improve ROM and strength required for successful completion of functional activities.  -  Recumbent bike., no added resistance, goal 40 RPM. Seat setting 5. For improved extremity mobility, muscular endurance, and activity tolerance; and to induce the analgesic effect of  aerobic exercise, stimulate improved joint nutrition, and prepare body structures and systems for following interventions. x 5 min  during subjective exam.     Therapeutic exercises: for functional strengthening and improved functional activity tolerance. Supine heel slides with strap for overpressure x20 reps with verbal cues for form/modification to pain Seated hamstring curl 2x15 with GTB with PT assist, verbal cues for eccentric control LAQ 2x15 with 3# AW with demo from PT Sit to stands with encouragement for equal weight bearing on LE, x10. ~150f ambulation with emphasis on heel strike and equal weight bearing for improved gait quality.   Manual therapy: to reduce pain and tissue  tension, improve range of motion, neuromodulation, in order to promote improved ability to complete functional activities. - right knee tibeofemoral AP, PA grade II-III, in supine, knee supported on pillow, and then also bent with PT sitting on R foot R patellar glides grade III all directions to improve mobility - STM with foam roll over right quads.      PT Education - 06/12/18 1434    Education Details  exercise form/technique    Person(s) Educated  Patient    Methods  Explanation;Demonstration;Tactile cues;Verbal cues    Comprehension  Verbalized understanding;Returned demonstration       PT Short Term Goals - 05/17/18 0736      PT SHORT TERM GOAL #1   Title  Be independent with home exercise program completed at least 3 times per week for self-management of symptoms.    Baseline  Initial HEP provided at initial eval (05/04/2018);     Time  2    Period  Weeks    Status  Achieved    Target Date  05/18/18        PT Long Term Goals - 06/06/18 1138      PT LONG TERM GOAL #1   Title  Be independent with a long-term home exercise program for self-management of symptoms.     Baseline  Initial HEP provided at IE (05/04/2018); is independent with current HEP but has not yet received long term HEP (06/06/2018);    Time  6    Period  Weeks    Status  Partially Met    Target Date  07/18/18      PT LONG TERM GOAL #2   Title  Patient will demonstrate improved ability to perform functional tasks as exhibited by at least 10 point improvement in FOTO score    Baseline  Initial 44 (05/04/2018); 35 (06/06/2018);     Time  6    Period  Weeks    Status  On-going    Target Date  07/18/18      PT LONG TERM GOAL #3   Title  Patient will increase BLE gross strength to 4+/5 as to improve functional strength for independent gait, increased standing tolerance and increased ADL ability.    Baseline  see objective exam    Time  6    Period  Weeks    Status  Partially Met    Target Date   07/18/18      PT LONG TERM GOAL #4   Title  Have right knee AROM equal or greater than 0-125 with no increase in pain except intermittent end range discomfort to allow patient to complete valued activities with less difficulty.     Baseline  -17-0-86 (ext- neutral- flex) (05/04/2018); -10-0-105 (06/06/2018)    Time  6    Period  Weeks    Status  Partially Met    Target Date  07/18/18      PT LONG TERM GOAL #5   Title  Complete community, work and/or recreational activities without limitation due to current condition.     Baseline  unable to work, unable to keep grandchildren (05/04/2018); has not returned to work, will be helping with grandchildren over the next two weeks.  (06/06/2018)    Time  6    Period  Weeks    Status  Partially Met    Target Date  07/18/18            Plan - 06/12/18 1457    Clinical Impression Statement  Pt with increased pain compared to previous sessions due to returning to work this AM. Reported being in a weight bearing position for several hours. Session focused on promoting pain management as well as strengthening in non-weight bearing positions for pt tolerance. PT and pt also discussed importance of resting, elevation, compression and proper footwear (will bring in shoes that she wears to work next session). Pt reported decrease in pain from 7/10 to 4/10 at end of session.    Rehab Potential  Good    Clinical Impairments Affecting Rehab Potential  (+) motivation, goal to return to work; (-) chronicity of symptoms, history of failed PT    PT Frequency  2x / week    PT Duration  6 weeks    PT Treatment/Interventions  ADLs/Self Care Home Management;Cryotherapy;Aquatic Therapy;Electrical Stimulation;Moist Heat;Gait training;Stair training;Functional mobility training;Therapeutic activities;Therapeutic exercise;Balance training;Neuromuscular re-education;Patient/family education;Manual techniques;Scar mobilization;Passive range of motion;Dry  needling;Taping;Spinal Manipulations;Joint Manipulations;Other (comment)    PT Next Visit Plan  assess response to last session and progress/regress exercises and upadate HEP as appropriate. continue functional strengthening as tolerated.  Work toward standing tolerance required for return to work.     PT Home Exercise Plan  Medbridge Access Code: [email or web address]     Consulted and Agree with Plan of Care  Patient       Patient will benefit from skilled therapeutic intervention in order to improve the following deficits and impairments:  Abnormal gait, Decreased endurance, Decreased mobility, Difficulty walking, Decreased range of motion, Decreased scar mobility, Impaired perceived functional ability, Obesity, Decreased activity tolerance, Decreased strength, Impaired flexibility, Pain, Postural dysfunction  Visit Diagnosis: Chronic pain of right knee  Difficulty in walking, not elsewhere classified     Problem List Patient Active Problem List   Diagnosis Date Noted  . Prehypertension 05/30/2018  . Borderline diabetes 04/25/2014  . Obesity (BMI 35.0-39.9 without comorbidity) 04/25/2014  . Pure hypercholesterolemia 04/25/2014    Lieutenant Diego PT, DPT 3:14 PM,06/12/18 [facility] [address], [place], [address] Phone: [phone]   Fax:  [phone]  Name: Mariah Sutton MRN: [id number] Date of Birth: 26-Aug-1960

## 2018-06-15 ENCOUNTER — Encounter: Payer: Self-pay | Admitting: Physical Therapy

## 2018-06-15 ENCOUNTER — Ambulatory Visit: Payer: PRIVATE HEALTH INSURANCE | Attending: Orthopedic Surgery | Admitting: Physical Therapy

## 2018-06-15 DIAGNOSIS — G8929 Other chronic pain: Secondary | ICD-10-CM

## 2018-06-15 DIAGNOSIS — R262 Difficulty in walking, not elsewhere classified: Secondary | ICD-10-CM | POA: Insufficient documentation

## 2018-06-15 DIAGNOSIS — M25561 Pain in right knee: Secondary | ICD-10-CM | POA: Insufficient documentation

## 2018-06-15 NOTE — Therapy (Signed)
[facility] [address], [place], [address] Phone: [phone]   Fax:  [phone]  Physical Therapy Treatment  Patient Details  Name: Mariah Sutton MRN: [id number] Date of Birth: [birth year] Referring Provider (PT):  Feliberto Gottron, [place]   Encounter Date: 06/15/2018  PT End of Session - 06/15/18 1532    Visit Number  10    Number of Visits  19    Date for PT Re-Evaluation  07/18/18    Authorization Time Period  Current cert period: 44/06/270 - 07/18/2018 (last PN: 06/06/2018);    Authorization - Visit Number  4    Authorization - Number of Visits  10    PT Start Time  1350    PT Stop Time  1440    PT Time Calculation (min)  50 min    Activity Tolerance  Patient tolerated treatment well;Patient limited by pain    Behavior During Therapy  WFL for tasks assessed/performed       Past Medical History:  Diagnosis Date  . Bronchitis   . GERD (gastroesophageal reflux disease)   . High cholesterol   . Knee pain    Right  . Pneumonia   . Pre-diabetes     Past Surgical History:  Procedure Laterality Date  . BREAST CYST ASPIRATION Bilateral 2000  . CESAREAN SECTION    . KNEE ARTHROSCOPY WITH MEDIAL MENISECTOMY Right 04/13/2018   Procedure: KNEE ARTHROSCOPY WITH MEDIAL AND LATERAL MENISECTOMY;  Surgeon: Hessie Knows, MD;  Location: [facility];  Service: Orthopedics;  Laterality: Right;  . SYNOVECTOMY  04/13/2018   Procedure: Partial SYNOVECTOMY;  Surgeon: Hessie Knows, MD;  Location: [facility];  Service: Orthopedics;;    There were no vitals filed for this visit.  Subjective Assessment - 06/15/18 1354    Subjective  Patient reports 5/10 pain at the anterior right knee. She went back to work Monday and will be working 8 hour days except for when she comes to PT she takes the rest of the day off. She feels like she was sent back to 8 hour days standing too early. She feels that the cortisone shot did  not make any difference. She has messaged Dr. Rudene Christians but has not yet heard anything back.  She states "I was okay" and was sore and went home and iced it following last treatment session. She states she has been icing it at home (2-3 times 30 min each) and during work (30 min) with some relief.     Pertinent History  Patient is a 58 y.o. female who presents to outpatient physical therapy with a referral for s/p R knee arthroscopy, stiffness. This patient's chief complaints consist of pain, stiffness, altered gait pattern, leading to the following functional deficits: difficulty with ambulation and weight bearing activities. Relevant past medical history and comorbidities include borderline diabetes, obesity, history of cecerian section    Limitations  Lifting;Standing;Walking;House hold activities    How long can you sit comfortably?  no limitation    How long can you stand comfortably?  20-30 min    How long can you walk comfortably?  15-20 min    Diagnostic tests  prior to surgery (see chart)    Patient Stated Goals  get back to work, be able to stand and walk    Currently in Pain?  Yes    Pain Score  5     Pain Location  Knee    Pain Orientation  Right;Anterior    Pain Descriptors / Indicators  Sore;Tender    Pain Type  Surgical pain    Pain Onset  More than a month ago    Effect of Pain on Daily Activities  Is having difficulty walking and maintaining work requirements.        OBJECTIVE:  R knee PROM after Manual:  - Flexion: 120 degrees (last measured 06/15/2018) - Extension: lacking 7 degrees (last measured 06/15/2018)   TREATMENT: Denies sensitivity to latex Denies long term steroid use Denies spinal surgery  Therapeutic exercise:to centralize symptoms and improve ROM and strength required for successful completion of functional activities. -Recumbent bike, no added resistance, goal 40 RPM.Seat setting 5.For improved extremity mobility, muscular endurance, and activity  tolerance; and to induce the analgesic effect of aerobic exercise, stimulate improved joint nutrition, and prepare body structures and systems for following interventions. x10mnduring subjective exam. - very short arc quad over foam roller with 10# weights,5 second holds for 2 min each side. and time for rest and transitions.  - medium arc quad over round bolster with 1# weights,5 second holds for 2 min each side. and time for rest and transitions.  -longarc quad with 10# weights,5 second holds, x 2 min each side and time for rest and transitions. - hip hikes off edge of stair with unilateral UE extremity,2x15 each side. - Sit to stands with buttocks tap on low plinth and  encouragement for equal weight bearing on LE, x15.  Manual therapy:to reduce pain and tissue tension, improve range of motion, neuromodulation, in order to promote improved ability to complete functional activities. - right knee tibeofemoral AP, PA grade II-III, in hooklying, and then also seated in dependent position including with distraction.  R patellar glides grade III all directions to improve mobility - STM with foam roll over right quads.  HOME EXERCISE PROGRAM Access Code: [email or web address]  URL: [email or web address]  Date: 05/30/2018  Prepared by: SRosita Kea  Exercises   Supine Heel Slide with Strap - 10-15 reps - 5 second hold - 2-3 Sets - 2x daily - 7x weekly   Standing Terminal Knee Extension at Wall with Ball - 10-15 reps - 5 second hold - 2 Sets - 1x daily - 7x weekly   Hooklying Straight Leg Raise - 10-15 reps - 1 second hold - 3 Sets - 1x daily - 3x weekly   Supine Hamstring Stretch with Strap - 3 reps - 30 second hold - 1 Sets - 1x daily - 3x weekly   Clamshell with Resistance - 10-15 reps - 1 second hold - 3 Sets - 1x daily - 3x weekly   Prone Terminal Knee Extension - 10-15 reps - 1 second hold - 3 Sets - 1x daily - 3x weekly   Supine Bridge with Resistance Band - 10-15  reps - 1 second hold - 3 Sets - 1x daily - 3x weekly   Standing Hip Hiking - 10-15 reps - 1 second hold - 3 Sets - 1x daily - 3x weekly  Patient response to treatment:  Pt tolerated treatment well overall. She had reduction of pain during session (to 1/10 from 5/10) and was able to tolerate strongly loaded quad exercises. Patient less tender to touch and pain is more localized near the anterior joint line/patellar tendon. She made significant knee flexion progress. Her exercises were regressed to include non-weight bearing today (compared to last workdue to reports of higher level of pain since weight bearing at work this morning from 7-12:30pm.Pt  required cuing for proper technique and to facilitate improved neuromuscular control, strength, range of motion, and functional ability.Patient is making overall progress towards goals, although it is slower thank she would like.   PT Education - 06/15/18 1532    Education Details  exercise form/technique. self-management advice.     Person(s) Educated  Patient    Methods  Explanation;Demonstration;Tactile cues;Verbal cues    Comprehension  Verbalized understanding;Returned demonstration       PT Short Term Goals - 05/17/18 0736      PT SHORT TERM GOAL #1   Title  Be independent with home exercise program completed at least 3 times per week for self-management of symptoms.    Baseline  Initial HEP provided at initial eval (05/04/2018);     Time  2    Period  Weeks    Status  Achieved    Target Date  05/18/18        PT Long Term Goals - 06/06/18 1138      PT LONG TERM GOAL #1   Title  Be independent with a long-term home exercise program for self-management of symptoms.     Baseline  Initial HEP provided at IE (05/04/2018); is independent with current HEP but has not yet received long term HEP (06/06/2018);    Time  6    Period  Weeks    Status  Partially Met    Target Date  07/18/18      PT LONG TERM GOAL #2   Title  Patient will  demonstrate improved ability to perform functional tasks as exhibited by at least 10 point improvement in FOTO score    Baseline  Initial 44 (05/04/2018); 35 (06/06/2018);     Time  6    Period  Weeks    Status  On-going    Target Date  07/18/18      PT LONG TERM GOAL #3   Title  Patient will increase BLE gross strength to 4+/5 as to improve functional strength for independent gait, increased standing tolerance and increased ADL ability.    Baseline  see objective exam    Time  6    Period  Weeks    Status  Partially Met    Target Date  07/18/18      PT LONG TERM GOAL #4   Title  Have right knee AROM equal or greater than 0-125 with no increase in pain except intermittent end range discomfort to allow patient to complete valued activities with less difficulty.     Baseline  -17-0-86 (ext- neutral- flex) (05/04/2018); -10-0-105 (06/06/2018)    Time  6    Period  Weeks    Status  Partially Met    Target Date  07/18/18      PT LONG TERM GOAL #5   Title  Complete community, work and/or recreational activities without limitation due to current condition.     Baseline  unable to work, unable to keep grandchildren (05/04/2018); has not returned to work, will be helping with grandchildren over the next two weeks.  (06/06/2018)    Time  6    Period  Weeks    Status  Partially Met    Target Date  07/18/18            Plan - 06/15/18 1534    Clinical Impression Statement  Pt tolerated treatment well overall. She had reduction of pain during session (to 1/10 from 5/10) and was able to tolerate strongly loaded quad  exercises. Patient less tender to touch and pain is more localized near the anterior joint line/patellar tendon. She made significant knee flexion progress. Her exercises were regressed to include non-weight bearing today (compared to last workdue to reports of higher level of pain since weight bearing at work this morning from 7-12:30pm.Pt required cuing for proper technique and  to facilitate improved neuromuscular control, strength, range of motion, and functional ability.Patient is making overall progress towards goals, although it is slower thank she would like.     Rehab Potential  Good    Clinical Impairments Affecting Rehab Potential  (+) motivation, goal to return to work; (-) chronicity of symptoms, history of failed PT    PT Frequency  2x / week    PT Duration  6 weeks    PT Treatment/Interventions  ADLs/Self Care Home Management;Cryotherapy;Aquatic Therapy;Electrical Stimulation;Moist Heat;Gait training;Stair training;Functional mobility training;Therapeutic activities;Therapeutic exercise;Balance training;Neuromuscular re-education;Patient/family education;Manual techniques;Scar mobilization;Passive range of motion;Dry needling;Taping;Spinal Manipulations;Joint Manipulations;Other (comment)    PT Next Visit Plan  assess response to last session and progress/regress exercises and upadate HEP as appropriate. continue functional strengthening as tolerated.  Work toward improving work tolerance.     PT Home Exercise Plan  Medbridge Access Code: [email or web address]     Consulted and Agree with Plan of Care  Patient       Patient will benefit from skilled therapeutic intervention in order to improve the following deficits and impairments:  Abnormal gait, Decreased endurance, Decreased mobility, Difficulty walking, Decreased range of motion, Decreased scar mobility, Impaired perceived functional ability, Obesity, Decreased activity tolerance, Decreased strength, Impaired flexibility, Pain, Postural dysfunction  Visit Diagnosis: Chronic pain of right knee  Difficulty in walking, not elsewhere classified     Problem List Patient Active Problem List   Diagnosis Date Noted  . Prehypertension 05/30/2018  . Borderline diabetes 04/25/2014  . Obesity (BMI 35.0-39.9 without comorbidity) 04/25/2014  . Pure hypercholesterolemia 04/25/2014    Nancy Nordmann, PT, DPT 06/15/2018,  3:35 PM  [facility] [address], [place], [address] Phone: [phone]   Fax:  [phone]  Name: Mariah Sutton MRN: [id number] Date of Birth: January 11, 1961

## 2018-06-19 ENCOUNTER — Ambulatory Visit: Payer: PRIVATE HEALTH INSURANCE | Admitting: Physical Therapy

## 2018-06-19 ENCOUNTER — Encounter: Payer: Self-pay | Admitting: Physical Therapy

## 2018-06-19 DIAGNOSIS — M25561 Pain in right knee: Secondary | ICD-10-CM | POA: Diagnosis not present

## 2018-06-19 DIAGNOSIS — G8929 Other chronic pain: Secondary | ICD-10-CM

## 2018-06-19 DIAGNOSIS — R262 Difficulty in walking, not elsewhere classified: Secondary | ICD-10-CM

## 2018-06-19 NOTE — Therapy (Signed)
[facility] [address] [place], [address] Phone: [phone]   Fax:  [phone]  Physical Therapy Treatment  Patient Details  Name: Mariah Sutton MRN: [id number] Date of Birth: 15-Oct-1960 Referring Provider (PT):  Feliberto Gottron, [place]   Encounter Date: 06/19/2018  PT End of Session - 06/19/18 1357    Visit Number  10    Number of Visits  19    Date for PT Re-Evaluation  07/18/18    Authorization Time Period  Current cert period: 73/22/0254 - 07/18/2018 (last PN: 06/06/2018);    Authorization - Visit Number  5    Authorization - Number of Visits  10    PT Start Time  1350    PT Stop Time  1430    PT Time Calculation (min)  40 min    Activity Tolerance  Patient tolerated treatment well;Patient limited by pain;Patient limited by fatigue    Behavior During Therapy  [facility] for tasks assessed/performed       Past Medical History:  Diagnosis Date  . Bronchitis   . GERD (gastroesophageal reflux disease)   . High cholesterol   . Knee pain    Right  . Pneumonia   . Pre-diabetes     Past Surgical History:  Procedure Laterality Date  . BREAST CYST ASPIRATION Bilateral 2000  . CESAREAN SECTION    . KNEE ARTHROSCOPY WITH MEDIAL MENISECTOMY Right 04/13/2018   Procedure: KNEE ARTHROSCOPY WITH MEDIAL AND LATERAL MENISECTOMY;  Surgeon: Hessie Knows, MD;  Location: [facility];  Service: Orthopedics;  Laterality: Right;  . SYNOVECTOMY  04/13/2018   Procedure: Partial SYNOVECTOMY;  Surgeon: Hessie Knows, MD;  Location: [facility];  Service: Orthopedics;;    There were no vitals filed for this visit.  Subjective Assessment - 06/19/18 1354    Subjective  Patient report she worked today 7-12:30pm and has 1/10 pain in the anterior right knee distal to the patella. She was off this weekend. She states she had no excessive soreness or pain following last session.  She did not work last Friday.     Pertinent  History  Patient is a 58 y.o. female who presents to outpatient physical therapy with a referral for s/p R knee arthroscopy, stiffness. This patient's chief complaints consist of pain, stiffness, altered gait pattern, leading to the following functional deficits: difficulty with ambulation and weight bearing activities. Relevant past medical history and comorbidities include borderline diabetes, obesity, history of cecerian section    Limitations  Lifting;Standing;Walking;House hold activities    How long can you sit comfortably?  no limitation    How long can you stand comfortably?  20-30 min    How long can you walk comfortably?  15-20 min    Diagnostic tests  prior to surgery (see chart)    Patient Stated Goals  get back to work, be able to stand and walk    Currently in Pain?  Yes    Pain Score  1     Pain Location  Knee    Pain Orientation  Right;Anterior    Pain Descriptors / Indicators  Sore    Pain Type  Surgical pain    Pain Onset  More than a month ago    Pain Frequency  Intermittent       OBJECTIVE:  R knee PROM after Manual:  - Flexion: 124 degrees (last measured 06/19/2018) - Extension: lacking 5 degrees (last measured 06/19/2018)   TREATMENT: Denies  sensitivity to latex Denies long term steroid use Denies spinal surgery  Therapeutic exercise:to centralize symptoms and improve ROM and strength required for successful completion of functional activities. -Recumbent bike, no added resistance, goal40 RPM.Seat setting 5.For improved extremity mobility, muscular endurance, and activity tolerance; and to induce the analgesic effect of aerobic exercise, stimulate improved joint nutrition, and prepare body structures and systems for following interventions. x34mnduring subjective exam. - very short arc quadover foam rollerwith10# weights,5 second holds for 2 min each side. and time for rest and transitions. -mediumarc quad over round bolsterwith1# weights,5  second holds for 2 min each side. and time for rest and transitions. -longarc quad with10# weights,5 second holds, x 2 min each side and time for rest and transitions. - hip hikes off edge of stair with unilateral UE extremity,2x15 each side. - Sit to stands with buttocks tap on low plinth and  encouragement for equal weight bearing on LE, 2x30.  Manual therapy:to reduce pain and tissue tension, improve range of motion, neuromodulation, in order to promote improved ability to complete functional activities. - right knee tibeofemoral AP, PAgrade II-III,in hooklying, and then also seated in dependent position including with distraction.  R patellar glides grade III all directions to improve mobility - STM with foam roll over right quads.  - STM over distal thigh muscles focusing on adductors and medical quads and hamstrings.  - passive right knee extenxion stretch with clinician overpressure10 second holds x 10.   HOME EXERCISE PROGRAM Access Code: [email or web address]  URL: [email or web address]  Date: 05/30/2018  Prepared by: SRosita Kea  Exercises   Supine Heel Slide with Strap - 10-15 reps - 5 second hold - 2-3 Sets - 2x daily - 7x weekly   Standing Terminal Knee Extension at Wall with Ball - 10-15 reps - 5 second hold - 2 Sets - 1x daily - 7x weekly   Hooklying Straight Leg Raise - 10-15 reps - 1 second hold - 3 Sets - 1x daily - 3x weekly   Supine Hamstring Stretch with Strap - 3 reps - 30 second hold - 1 Sets - 1x daily - 3x weekly   Clamshell with Resistance - 10-15 reps - 1 second hold - 3 Sets - 1x daily - 3x weekly   Prone Terminal Knee Extension - 10-15 reps - 1 second hold - 3 Sets - 1x daily - 3x weekly   Supine Bridge with Resistance Band - 10-15 reps - 1 second hold - 3 Sets - 1x daily - 3x weekly   Standing Hip Hiking - 10-15 reps - 1 second hold - 3 Sets - 1x daily - 3x weekly  Patient response to treatment:  Pt tolerated treatment welloverall. She  had lower pain today but exercises were minimally progressed with expectation pt would be undergoing prolonged standing tomorrow and the rest of the week at work and this is the first full week pt will be working since surgery. Patient continues to be less tender to touch and pain is more localized near the anterior joint line/patellar tendon. She made significant knee flexion progress. Pt required cuing for proper technique and to facilitate improved neuromuscular control, strength, range of motion, and functional ability.Patient is making overall good progress towards goals, although it is slower thank she would like.    PT Education - 06/19/18 1356    Education Details  exercise form/technique. self-management advice    Person(s) Educated  Patient    Methods  Explanation;Demonstration;Tactile cues;Verbal cues    Comprehension  Verbalized understanding;Returned demonstration       PT Short Term Goals - 05/17/18 0736      PT SHORT TERM GOAL #1   Title  Be independent with home exercise program completed at least 3 times per week for self-management of symptoms.    Baseline  Initial HEP provided at initial eval (05/04/2018);     Time  2    Period  Weeks    Status  Achieved    Target Date  05/18/18        PT Long Term Goals - 06/06/18 1138      PT LONG TERM GOAL #1   Title  Be independent with a long-term home exercise program for self-management of symptoms.     Baseline  Initial HEP provided at IE (05/04/2018); is independent with current HEP but has not yet received long term HEP (06/06/2018);    Time  6    Period  Weeks    Status  Partially Met    Target Date  07/18/18      PT LONG TERM GOAL #2   Title  Patient will demonstrate improved ability to perform functional tasks as exhibited by at least 10 point improvement in FOTO score    Baseline  Initial 44 (05/04/2018); 35 (06/06/2018);     Time  6    Period  Weeks    Status  On-going    Target Date  07/18/18      PT LONG  TERM GOAL #3   Title  Patient will increase BLE gross strength to 4+/5 as to improve functional strength for independent gait, increased standing tolerance and increased ADL ability.    Baseline  see objective exam    Time  6    Period  Weeks    Status  Partially Met    Target Date  07/18/18      PT LONG TERM GOAL #4   Title  Have right knee AROM equal or greater than 0-125 with no increase in pain except intermittent end range discomfort to allow patient to complete valued activities with less difficulty.     Baseline  -17-0-86 (ext- neutral- flex) (05/04/2018); -10-0-105 (06/06/2018)    Time  6    Period  Weeks    Status  Partially Met    Target Date  07/18/18      PT LONG TERM GOAL #5   Title  Complete community, work and/or recreational activities without limitation due to current condition.     Baseline  unable to work, unable to keep grandchildren (05/04/2018); has not returned to work, will be helping with grandchildren over the next two weeks.  (06/06/2018)    Time  6    Period  Weeks    Status  Partially Met    Target Date  07/18/18            Plan - 06/19/18 1417    Clinical Impression Statement  Pt tolerated treatment welloverall. She had lower pain today but exercises were minimally progressed with expectation pt would be undergoing prolonged standing tomorrow and the rest of the week at work and this is the first full week pt will be working since surgery. Patient continues to be less tender to touch and pain is more localized near the anterior joint line/patellar tendon. She made significant knee flexion progress. Pt required cuing for proper technique and to facilitate improved neuromuscular control, strength, range of motion, and functional ability.Patient is making overall good progress towards  goals, although it is slower thank she would like.     Rehab Potential  Good    Clinical Impairments Affecting Rehab Potential  (+) motivation, goal to return to work; (-)  chronicity of symptoms, history of failed PT    PT Frequency  2x / week    PT Duration  6 weeks    PT Treatment/Interventions  ADLs/Self Care Home Management;Cryotherapy;Aquatic Therapy;Electrical Stimulation;Moist Heat;Gait training;Stair training;Functional mobility training;Therapeutic activities;Therapeutic exercise;Balance training;Neuromuscular re-education;Patient/family education;Manual techniques;Scar mobilization;Passive range of motion;Dry needling;Taping;Spinal Manipulations;Joint Manipulations;Other (comment)    PT Next Visit Plan  assess response to last session and progress/regress exercises and upadate HEP as appropriate. continue functional strengthening as tolerated.  Work toward improving work tolerance.     PT Home Exercise Plan  Medbridge Access Code: [email or web address]     Consulted and Agree with Plan of Care  Patient       Patient will benefit from skilled therapeutic intervention in order to improve the following deficits and impairments:  Abnormal gait, Decreased endurance, Decreased mobility, Difficulty walking, Decreased range of motion, Decreased scar mobility, Impaired perceived functional ability, Obesity, Decreased activity tolerance, Decreased strength, Impaired flexibility, Pain, Postural dysfunction  Visit Diagnosis: Chronic pain of right knee  Difficulty in walking, not elsewhere classified     Problem List Patient Active Problem List   Diagnosis Date Noted  . Prehypertension 05/30/2018  . Borderline diabetes 04/25/2014  . Obesity (BMI 35.0-39.9 without comorbidity) 04/25/2014  . Pure hypercholesterolemia 04/25/2014    Nancy Nordmann, PT, DPT 06/19/2018, 2:20 PM  [facility] [address], [place], [address] Phone: [phone]   Fax:  [phone]  Name: Mariah Sutton MRN: [id number] Date of Birth: 1960/09/12

## 2018-06-22 ENCOUNTER — Encounter: Payer: Self-pay | Admitting: Physical Therapy

## 2018-06-22 ENCOUNTER — Ambulatory Visit: Payer: PRIVATE HEALTH INSURANCE | Admitting: Physical Therapy

## 2018-06-22 DIAGNOSIS — M25561 Pain in right knee: Secondary | ICD-10-CM | POA: Diagnosis not present

## 2018-06-22 DIAGNOSIS — R262 Difficulty in walking, not elsewhere classified: Secondary | ICD-10-CM

## 2018-06-22 DIAGNOSIS — G8929 Other chronic pain: Secondary | ICD-10-CM

## 2018-06-22 NOTE — Therapy (Signed)
[facility] [address], [place], [address] Phone: [phone]   Fax:  [phone]  Physical Therapy Treatment  Patient Details  Name: Mariah Sutton MRN: [id number] Date of Birth: 1961/04/03 Referring Provider (PT):  Feliberto Gottron, [place]   Encounter Date: 06/22/2018  PT End of Session - 06/22/18 1509    Visit Number  12    Number of Visits  19    Date for PT Re-Evaluation  07/18/18    Authorization Type  [employer]    Authorization Time Period  Current cert period: 36/46/8032 - 07/18/2018 (last PN: 06/06/2018);    Authorization - Visit Number  6    Authorization - Number of Visits  10    PT Start Time  1430    PT Stop Time  1515    PT Time Calculation (min)  45 min    Equipment Utilized During Treatment  Gait belt    Activity Tolerance  Patient tolerated treatment well;Patient limited by fatigue    Behavior During Therapy  WFL for tasks assessed/performed       Past Medical History:  Diagnosis Date  . Bronchitis   . GERD (gastroesophageal reflux disease)   . High cholesterol   . Knee pain    Right  . Pneumonia   . Pre-diabetes     Past Surgical History:  Procedure Laterality Date  . BREAST CYST ASPIRATION Bilateral 2000  . CESAREAN SECTION    . KNEE ARTHROSCOPY WITH MEDIAL MENISECTOMY Right 04/13/2018   Procedure: KNEE ARTHROSCOPY WITH MEDIAL AND LATERAL MENISECTOMY;  Surgeon: Hessie Knows, MD;  Location: [facility];  Service: Orthopedics;  Laterality: Right;  . SYNOVECTOMY  04/13/2018   Procedure: Partial SYNOVECTOMY;  Surgeon: Hessie Knows, MD;  Location: [facility];  Service: Orthopedics;;    There were no vitals filed for this visit.  Subjective Assessment - 06/22/18 1551    Subjective  Patient reports she rates her pain 0/10 in her knee but both of her feet hurt from working since 7am, and this may be masking any knee pain she may have. She reports she had no excessive soreness or  pain following last phyisca ltherapy treatment session. She worked yesterday and had some sorenes in her knee by the end of the day but it felt better after icing it in the evening and did not keep her up. She has been doing her HEP.     Pertinent History  Patient is a 58 y.o. female who presents to outpatient physical therapy with a referral for s/p R knee arthroscopy, stiffness. This patient's chief complaints consist of pain, stiffness, altered gait pattern, leading to the following functional deficits: difficulty with ambulation and weight bearing activities. Relevant past medical history and comorbidities include borderline diabetes, obesity, history of cecerian section    Limitations  Lifting;Standing;Walking;House hold activities    How long can you sit comfortably?  no limitation    How long can you stand comfortably?  20-30 min    How long can you walk comfortably?  15-20 min    Diagnostic tests  prior to surgery (see chart)    Patient Stated Goals  get back to work, be able to stand and walk    Currently in Pain?  No/denies   no knee pain. 5/10 in pain in bilateral feet aching after    Pain Location  Foot    Pain Orientation  Right;Left    Pain Descriptors / Indicators  Aching    Pain Onset  More than a month ago          OBJECTIVE:   TREATMENT: Denies sensitivity to latex Denies long term steroid use Denies spinal surgery  Therapeutic exercise:to centralize symptoms and improve ROM and strength required for successful completion of functional activities. -longarc quad with10# weights,5 second holds, x2 min each sideand time for rest and transitions. - hip hikes off edge of stair with unilateral UE extremity,2x15 each side.  Therapeutic activities: for functional strengthening and improved functional activity tolerance. -Dynamic ambulation on treadmill at 0.6 mph 0% grade with gait belt applied and CGA with close monitoring for safety. Cuing for improved gait  cycle including heel strike, even gait, improved posture, tep length, and gait speed.   Progressed to ambulation x 400 feet in clinic with further cuing for arm swing and even gait. To improve gait  -Sit to stands withbuttocks tap on low plinth andencouragement for equal weight bearing on LE, 3x15. - ball toss with modified single leg stance with contralateral foot on weighted ball, then unweighted ball 2x 15 each side. CGA x 1 for safety with gait belt. To improve hip, knee, and single leg stance stability, endurance, and activity tolerance to improve gait and balance for functional activities.   Manual therapy:to reduce pain and tissue tension, improve range of motion, neuromodulation, in order to promote improved ability to complete functional activities. - right knee tibeofemoral AP, PAgrade II-III,inhooklying,and then also seated in dependent position including with distraction. R patellar glides grade III all directions to improve mobility - STM with foam roll over right quads.  - STM over distal thigh muscles focusing on adductors and medical quads and hamstrings.  - passive right knee extenxion stretch with clinician overpressure10 second holds x 10.   HOME EXERCISE PROGRAM Access Code: [email or web address]  URL: [email or web address]  Date: 05/30/2018  Prepared by: Rosita Kea   Exercises   Supine Heel Slide with Strap - 10-15 reps - 5 second hold - 2-3 Sets - 2x daily - 7x weekly   Standing Terminal Knee Extension at Wall with Ball - 10-15 reps - 5 second hold - 2 Sets - 1x daily - 7x weekly   Hooklying Straight Leg Raise - 10-15 reps - 1 second hold - 3 Sets - 1x daily - 3x weekly   Supine Hamstring Stretch with Strap - 3 reps - 30 second hold - 1 Sets - 1x daily - 3x weekly   Clamshell with Resistance - 10-15 reps - 1 second hold - 3 Sets - 1x daily - 3x weekly   Prone Terminal Knee Extension - 10-15 reps - 1 second hold - 3 Sets - 1x daily - 3x weekly    Supine Bridge with Resistance Band - 10-15 reps - 1 second hold - 3 Sets - 1x daily - 3x weekly   Standing Hip Hiking - 10-15 reps - 1 second hold - 3 Sets - 1x daily - 3x weekly  Patient response to treatment:  Pt tolerated treatment well. She was able to progress weight bearing exercise intensity without increased pain. She continues to have discomfort with end range knee flexion and extension with overpressure. Pt required cuing for proper technique and to facilitate improved neuromuscular control, strength, range of motion, and functional ability with improvement following.      PT Education - 06/22/18 1555    Education Details  exercise form/techinque. Self-management advice    Person(s) Educated  Patient    Methods  Explanation;Tactile cues;Demonstration;Verbal cues    Comprehension  Verbalized understanding;Returned demonstration       PT Short Term Goals - 05/17/18 0736      PT SHORT TERM GOAL #1   Title  Be independent with home exercise program completed at least 3 times per week for self-management of symptoms.    Baseline  Initial HEP provided at initial eval (05/04/2018);     Time  2    Period  Weeks    Status  Achieved    Target Date  05/18/18        PT Long Term Goals - 06/06/18 1138      PT LONG TERM GOAL #1   Title  Be independent with a long-term home exercise program for self-management of symptoms.     Baseline  Initial HEP provided at IE (05/04/2018); is independent with current HEP but has not yet received long term HEP (06/06/2018);    Time  6    Period  Weeks    Status  Partially Met    Target Date  07/18/18      PT LONG TERM GOAL #2   Title  Patient will demonstrate improved ability to perform functional tasks as exhibited by at least 10 point improvement in FOTO score    Baseline  Initial 44 (05/04/2018); 35 (06/06/2018);     Time  6    Period  Weeks    Status  On-going    Target Date  07/18/18      PT LONG TERM GOAL #3   Title   Patient will increase BLE gross strength to 4+/5 as to improve functional strength for independent gait, increased standing tolerance and increased ADL ability.    Baseline  see objective exam    Time  6    Period  Weeks    Status  Partially Met    Target Date  07/18/18      PT LONG TERM GOAL #4   Title  Have right knee AROM equal or greater than 0-125 with no increase in pain except intermittent end range discomfort to allow patient to complete valued activities with less difficulty.     Baseline  -17-0-86 (ext- neutral- flex) (05/04/2018); -10-0-105 (06/06/2018)    Time  6    Period  Weeks    Status  Partially Met    Target Date  07/18/18      PT LONG TERM GOAL #5   Title  Complete community, work and/or recreational activities without limitation due to current condition.     Baseline  unable to work, unable to keep grandchildren (05/04/2018); has not returned to work, will be helping with grandchildren over the next two weeks.  (06/06/2018)    Time  6    Period  Weeks    Status  Partially Met    Target Date  07/18/18            Plan - 06/22/18 1607    Clinical Impression Statement  Overall patient is making excellent progress towards goals at this point. She has been able to tolerate more work hours with less pain this week so far. She tolerated exercise progression today. Patient continues to be less tender to touch and pain is more localized near the anterior joint line/patellar tendon. She made significant knee flexion progress since initial eval.  Pt continues to have ROM, pain, gait deviations, and activity tolerance restrictions that impact her daily activities. She would benefit from continued physical therapy interventions  to work towards stated goals and return to PLOF.     Rehab Potential  Good    Clinical Impairments Affecting Rehab Potential  (+) motivation, goal to return to work; (-) chronicity of symptoms, history of failed PT    PT Frequency  2x / week    PT  Duration  6 weeks    PT Treatment/Interventions  ADLs/Self Care Home Management;Cryotherapy;Aquatic Therapy;Electrical Stimulation;Moist Heat;Gait training;Stair training;Functional mobility training;Therapeutic activities;Therapeutic exercise;Balance training;Neuromuscular re-education;Patient/family education;Manual techniques;Scar mobilization;Passive range of motion;Dry needling;Taping;Spinal Manipulations;Joint Manipulations;Other (comment)    PT Next Visit Plan  continue progresive LE and functional stregthening as tolerated. Update HEP as appropriate.     PT Home Exercise Plan  Medbridge Access Code: [email or web address]     Consulted and Agree with Plan of Care  Patient       Patient will benefit from skilled therapeutic intervention in order to improve the following deficits and impairments:  Abnormal gait, Decreased endurance, Decreased mobility, Difficulty walking, Decreased range of motion, Decreased scar mobility, Impaired perceived functional ability, Obesity, Decreased activity tolerance, Decreased strength, Impaired flexibility, Pain, Postural dysfunction  Visit Diagnosis: Chronic pain of right knee  Difficulty in walking, not elsewhere classified     Problem List Patient Active Problem List   Diagnosis Date Noted  . Prehypertension 05/30/2018  . Borderline diabetes 04/25/2014  . Obesity (BMI 35.0-39.9 without comorbidity) 04/25/2014  . Pure hypercholesterolemia 04/25/2014    Nancy Nordmann, PT, DPT 06/22/2018, 4:15 PM  [facility] [address], [place], [address] Phone: [phone]   Fax:  [phone]  Name: Mariah Sutton MRN: [id number] Date of Birth: [birth year]

## 2018-06-26 ENCOUNTER — Ambulatory Visit: Payer: PRIVATE HEALTH INSURANCE | Admitting: Physical Therapy

## 2018-06-26 DIAGNOSIS — M25561 Pain in right knee: Secondary | ICD-10-CM | POA: Diagnosis not present

## 2018-06-26 DIAGNOSIS — G8929 Other chronic pain: Secondary | ICD-10-CM

## 2018-06-26 DIAGNOSIS — R262 Difficulty in walking, not elsewhere classified: Secondary | ICD-10-CM

## 2018-06-26 NOTE — Therapy (Signed)
[facility] [address] [place], [address] Phone: [phone]   Fax:  [phone]  Physical Therapy Treatment  Patient Details  Name: Mariah Sutton MRN: [id number] Date of Birth: 12-30-1960 Referring Provider (PT):  Mariah Sutton, [place]   Encounter Date: 06/26/2018  PT End of Session - 06/26/18 1357    Visit Number  13    Number of Visits  19    Date for PT Re-Evaluation  07/18/18    Authorization Type  [employer] reporting period from 06/06/2018    Authorization Time Period  Current cert period: 74/94/4967 - 07/18/2018 (last PN: 06/06/2018);    Authorization - Visit Number  7    Authorization - Number of Visits  10    PT Start Time  1350    PT Stop Time  1430    PT Time Calculation (min)  40 min    Equipment Utilized During Treatment  Gait belt    Activity Tolerance  Patient tolerated treatment well;Patient limited by fatigue    Behavior During Therapy  WFL for tasks assessed/performed       Past Medical History:  Diagnosis Date  . Bronchitis   . GERD (gastroesophageal reflux disease)   . High cholesterol   . Knee pain    Right  . Pneumonia   . Pre-diabetes     Past Surgical History:  Procedure Laterality Date  . BREAST CYST ASPIRATION Bilateral 2000  . CESAREAN SECTION    . KNEE ARTHROSCOPY WITH MEDIAL MENISECTOMY Right 04/13/2018   Procedure: KNEE ARTHROSCOPY WITH MEDIAL AND LATERAL MENISECTOMY;  Surgeon: Hessie Knows, MD;  Location: [facility];  Service: Orthopedics;  Laterality: Right;  . SYNOVECTOMY  04/13/2018   Procedure: Partial SYNOVECTOMY;  Surgeon: Hessie Knows, MD;  Location: [facility];  Service: Orthopedics;;    There were no vitals filed for this visit.  Subjective Assessment - 06/26/18 1354    Subjective  Patient reports 1/10 right knee pain and worse pain in the top of both feet after working this morning. She is frustrated with ongoing foot pain. She has tried various  shoes including those for diabetics without improvement.  She continues to do her HEP, still some difficulty with the hip hikes.     Pertinent History  Patient is a 58 y.o. female who presents to outpatient physical therapy with a referral for s/p R knee arthroscopy, stiffness. This patient's chief complaints consist of pain, stiffness, altered gait pattern, leading to the following functional deficits: difficulty with ambulation and weight bearing activities. Relevant past medical history and comorbidities include borderline diabetes, obesity, history of cecerian section    Limitations  Lifting;Standing;Walking;House hold activities    How long can you sit comfortably?  no limitation    How long can you stand comfortably?  20-30 min    How long can you walk comfortably?  15-20 min    Diagnostic tests  prior to surgery (see chart)    Patient Stated Goals  get back to work, be able to stand and walk    Currently in Pain?  Yes    Pain Score  1     Pain Location  Knee    Pain Orientation  Right    Pain Descriptors / Indicators  Aching    Pain Type  Surgical pain    Pain Radiating Towards  bilateral feet    Pain Onset  More than a month ago    Pain Frequency  Intermittent       OBJECTIVE:   TREATMENT: Denies sensitivity to latex Denies long term steroid use Denies spinal surgery  Therapeutic exercise:to centralize symptoms and improve ROM and strength required for successful completion of functional activities. - Toe splays, repeated 3 sec holds. Ball of foot and heel maintains contact with floor. To improve intrinsic foot muscle activation and strength in order to better support arch and intrinsic foot structures. X 2 min after instructions on how to complete.  - Toe Yoga: great toe extension with small toes flexion pressure into floor, repeated 3 sec holds; small toes extension with great toe flexion pressure into floor, repeated 3 sec holds. Ball of foot and heel maintains contact  with floor. To improve intrinsic foot muscle activation and strength in order to better support arch and intrinsic foot structures. x2 min practice following instruction on how to complete. - seated arch raises using pen and penny to assist with learning proper activation pattern. To improve intrinsic foot muscle activation and strength in order to better support arch and intrinsic foot structures. x2 min practice each side following instruction on how to complete with intensive tactile, verbal, and visual cuing.  - seated isometric bilateral foot inversion against ball. To improve posterior tibialis strength and activity tolerance to reduce foot pain with prolonged standing and to teach pt exercise for independent practice at home. 3 second hold x 10. Cuing for proper form.  - standing toe raises with heels elevated on towel roll, to improve eccentric tibialis anterior activation to decrease pain with prolonged standing, x 20 with cuing for slow lower and appropriate technique for home.  - hip hikes off edge of stair with unilateral UE extremity,2x15 each side. With cuing for straight knee. To improve hip strength to better support knee in single leg stance activities such as walking.   Therapeutic activities: for functional strengthening and improved functional activity tolerance. -Dynamic ambulation on treadmill at 0.7 mph 0% grade with SBA and close monitoring for safety. Cuing for improved gait cycle including heel strike, even gait, improved posture, tep length, and gait speed.   x 5 min during subjective exam.  - step downs forward/ step up back from 6 inch step with unilateral support  -Sit to stands withbuttocks tap on low plinth andencouragement for equal weight bearing on LE,3x15. - ball toss with modified single leg stance with contralateral foot on unweighted ball then SLS with no ball.Marland Kitchen CGA x 1 for safety with gait belt. Pt required mod A to prevent fall with loss of balance. To  improve hip, knee, and single leg stance stability, endurance, and activity tolerance to improve gait and balance for functional activities. Cuing for lifting foot prior to tossing ball to get the most out of the activity.  - heels elevated squat with BUE support as needed. 2x10. To improve quad strength and tolerance for quad dominant squat. Required blocking of hips by clinician to prevent excessive posterior shift of hips.   HOME EXERCISE PROGRAM Access Code: [email or web address]  URL: [email or web address]  Date: 06/26/2018  Prepared by: Rosita Kea   Exercises  Supine Heel Slide with Strap - 10-15 reps - 5 second hold - 2-3 Sets - 2x daily - 7x weekly  Standing Terminal Knee Extension at Wall with Ball - 10-15 reps - 5 second hold - 2 Sets - 1x daily - 7x weekly  Hooklying Straight Leg Raise - 10-15 reps - 1 second hold - 3 Sets - 1x daily - 3x weekly  Supine Hamstring Stretch with Strap - 3 reps - 30 second hold - 1 Sets - 1x daily - 3x weekly  Clamshell with Resistance - 10-15 reps - 1 second hold - 3 Sets - 1x daily - 3x weekly  Prone Terminal Knee Extension - 10-15 reps - 1 second hold - 3 Sets - 1x daily - 3x weekly  Supine Bridge with Resistance Band - 10-15 reps - 1 second hold - 3 Sets - 1x daily - 3x weekly  Standing Hip Hiking - 10-15 reps - 1 second hold - 3 Sets - 1x daily - 3x weekly  Toe Raises with Counter Support - 10-15 reps - 1 second hold - 3 Sets - 1x daily - 3x weekly  Toe Spreading - 20 reps - 3second hold - 1x daily - 3x weekly  Seated Lesser Toes Extension - 20 reps - 3 second hold - 1x daily - 3x weekly  Arch Lifting - 20 reps - 3 second hold - 1x daily - 3x weekly  Seated Great Toe Extension - 20 reps - 3 second hold - 1x daily - 3x weekly  Isometric Ankle Inversion - 20 reps - 5second hold - 1x daily - 3x weekly   Patient response to treatment:  Pt tolerated treatment well. She was able to progress weight bearing exercise intensity without increased pain.  Ankle screen was performed and exercises provided for pt to complete independently at home to improve foot pain that is hindering her recovery from knee pain. Patient demo good understanding and technique. Ankle/foot exercises were also incorporated to contribute to improved knee stability. Pt required cuing for proper technique and to facilitate improved neuromuscular control, strength, range of motion, and functional ability with improvement following.   PT Education - 06/26/18 1357    Education Details  exercise form/technique. Self-management advice    Person(s) Educated  Patient    Methods  Explanation;Demonstration;Tactile cues;Verbal cues    Comprehension  Verbalized understanding;Returned demonstration       PT Short Term Goals - 05/17/18 0736      PT SHORT TERM GOAL #1   Title  Be independent with home exercise program completed at least 3 times per week for self-management of symptoms.    Baseline  Initial HEP provided at initial eval (05/04/2018);     Time  2    Period  Weeks    Status  Achieved    Target Date  05/18/18        PT Long Term Goals - 06/06/18 1138      PT LONG TERM GOAL #1   Title  Be independent with a long-term home exercise program for self-management of symptoms.     Baseline  Initial HEP provided at IE (05/04/2018); is independent with current HEP but has not yet received long term HEP (06/06/2018);    Time  6    Period  Weeks    Status  Partially Met    Target Date  07/18/18      PT LONG TERM GOAL #2   Title  Patient will demonstrate improved ability to perform functional tasks as exhibited by at least 10 point improvement in FOTO score    Baseline  Initial 44 (05/04/2018); 35 (06/06/2018);     Time  6    Period  Weeks    Status  On-going    Target Date  07/18/18      PT LONG TERM GOAL #3   Title  Patient will increase BLE  gross strength to 4+/5 as to improve functional strength for independent gait, increased standing tolerance and increased  ADL ability.    Baseline  see objective exam    Time  6    Period  Weeks    Status  Partially Met    Target Date  07/18/18      PT LONG TERM GOAL #4   Title  Have right knee AROM equal or greater than 0-125 with no increase in pain except intermittent end range discomfort to allow patient to complete valued activities with less difficulty.     Baseline  -17-0-86 (ext- neutral- flex) (05/04/2018); -10-0-105 (06/06/2018)    Time  6    Period  Weeks    Status  Partially Met    Target Date  07/18/18      PT LONG TERM GOAL #5   Title  Complete community, work and/or recreational activities without limitation due to current condition.     Baseline  unable to work, unable to keep grandchildren (05/04/2018); has not returned to work, will be helping with grandchildren over the next two weeks.  (06/06/2018)    Time  6    Period  Weeks    Status  Partially Met    Target Date  07/18/18            Plan - 06/27/18 1449    Clinical Impression Statement  Overall patient is making excellent progress towards goals at this point. She has demonstrated continued improved tolerance for work activities. She reports her feet are bothering her more than her knee, so ankle/foot exercises were prescribed to help her work toward eleiviating her foot pain with independent HEP that will also provided improved support to her knee for improved work function and decreased knee pain at work.  She tolerated exercise progression today including quad dominate quats. Patient continues to be less tender to touch and pain is more localized near the anterior joint line/patellar tendon. She made significant knee flexion progress since initial eval. Pt continues to have ROM, pain, gait deviations, and activity tolerance restrictions that impact her daily activities. She would benefit from continued physical therapy interventions to work towards stated goals and return to PLOF.     Rehab Potential  Good    Clinical  Impairments Affecting Rehab Potential  (+) motivation, goal to return to work; (-) chronicity of symptoms, history of failed PT    PT Frequency  2x / week    PT Duration  6 weeks    PT Treatment/Interventions  ADLs/Self Care Home Management;Cryotherapy;Aquatic Therapy;Electrical Stimulation;Moist Heat;Gait training;Stair training;Functional mobility training;Therapeutic activities;Therapeutic exercise;Balance training;Neuromuscular re-education;Patient/family education;Manual techniques;Scar mobilization;Passive range of motion;Dry needling;Taping;Spinal Manipulations;Joint Manipulations;Other (comment)    PT Next Visit Plan  continue progresive LE and functional stregthening as tolerated. Update HEP as appropriate.     PT Home Exercise Plan  Medbridge Access Code: [email or web address]     Consulted and Agree with Plan of Care  Patient       Patient will benefit from skilled therapeutic intervention in order to improve the following deficits and impairments:  Abnormal gait, Decreased endurance, Decreased mobility, Difficulty walking, Decreased range of motion, Decreased scar mobility, Impaired perceived functional ability, Obesity, Decreased activity tolerance, Decreased strength, Impaired flexibility, Pain, Postural dysfunction  Visit Diagnosis: Chronic pain of right knee  Difficulty in walking, not elsewhere classified     Problem List Patient Active Problem List   Diagnosis Date Noted  . Prehypertension 05/30/2018  . Borderline diabetes 04/25/2014  .  Obesity (BMI 35.0-39.9 without comorbidity) 04/25/2014  . Pure hypercholesterolemia 04/25/2014    Nancy Nordmann, PT, DPT 06/27/2018, 2:50 PM  [facility] [address] [place], [address] Phone: [phone]   Fax:  [phone]  Name: PRISCILLIA FOUCH MRN: [id number] Date of Birth: 1961-05-09

## 2018-06-27 ENCOUNTER — Encounter: Payer: Self-pay | Admitting: Physical Therapy

## 2018-06-29 ENCOUNTER — Encounter: Payer: Self-pay | Admitting: Physical Therapy

## 2018-06-29 ENCOUNTER — Ambulatory Visit: Payer: PRIVATE HEALTH INSURANCE | Admitting: Physical Therapy

## 2018-06-29 DIAGNOSIS — G8929 Other chronic pain: Secondary | ICD-10-CM

## 2018-06-29 DIAGNOSIS — M25561 Pain in right knee: Principal | ICD-10-CM

## 2018-06-29 DIAGNOSIS — R262 Difficulty in walking, not elsewhere classified: Secondary | ICD-10-CM

## 2018-06-29 NOTE — Therapy (Signed)
[facility] [address], [place], [address] Phone: [phone]   Fax:  [phone]  Physical Therapy Treatment  Patient Details  Name: Mariah Sutton MRN: [id number] Date of Birth: [birth year] Referring Provider (PT):  Feliberto Gottron, [place]   Encounter Date: 06/29/2018  PT End of Session - 06/29/18 1352    Visit Number  14    Number of Visits  19    Date for PT Re-Evaluation  07/18/18    Authorization Type  [employer] reporting period from 06/06/2018    Authorization Time Period  Current cert period: 02/54/2706 - 07/18/2018 (last PN: 06/06/2018);    Authorization - Visit Number  8    Authorization - Number of Visits  10    PT Start Time  2376    PT Stop Time  1427    PT Time Calculation (min)  40 min    Equipment Utilized During Treatment  Gait belt    Activity Tolerance  Patient tolerated treatment well;Patient limited by fatigue;Patient limited by pain    Behavior During Therapy  WFL for tasks assessed/performed       Past Medical History:  Diagnosis Date  . Bronchitis   . GERD (gastroesophageal reflux disease)   . High cholesterol   . Knee pain    Right  . Pneumonia   . Pre-diabetes     Past Surgical History:  Procedure Laterality Date  . BREAST CYST ASPIRATION Bilateral 2000  . CESAREAN SECTION    . KNEE ARTHROSCOPY WITH MEDIAL MENISECTOMY Right 04/13/2018   Procedure: KNEE ARTHROSCOPY WITH MEDIAL AND LATERAL MENISECTOMY;  Surgeon: Hessie Knows, MD;  Location: [facility];  Service: Orthopedics;  Laterality: Right;  . SYNOVECTOMY  04/13/2018   Procedure: Partial SYNOVECTOMY;  Surgeon: Hessie Knows, MD;  Location: [facility];  Service: Orthopedics;;    There were no vitals filed for this visit.  Subjective Assessment - 06/29/18 1350    Subjective  Patient reportss 1/10 pain in the anterior right knee. States it has been bothering her more since working yesterday. She states her feet  continue to hurt and continues to associate it with working on a concrete floor. She states her HEP is okay and she has tried the foot exercises with some difficulty with the arch raises.     Pertinent History  Patient is a 58 y.o. female who presents to outpatient physical therapy with a referral for s/p R knee arthroscopy, stiffness. This patient's chief complaints consist of pain, stiffness, altered gait pattern, leading to the following functional deficits: difficulty with ambulation and weight bearing activities. Relevant past medical history and comorbidities include borderline diabetes, obesity, history of cecerian section    Limitations  Lifting;Standing;Walking;House hold activities    How long can you sit comfortably?  no limitation    How long can you stand comfortably?  20-30 min    How long can you walk comfortably?  15-20 min    Diagnostic tests  prior to surgery (see chart)    Patient Stated Goals  get back to work, be able to stand and walk    Currently in Pain?  Yes    Pain Score  1     Pain Location  Knee    Pain Orientation  Right    Pain Descriptors / Indicators  Aching    Pain Type  Surgical pain    Pain Radiating Towards  bilateral feet    Pain Onset  More  than a month ago         OBJECTIVE:   TREATMENT: Denies sensitivity to latex Denies long term steroid use Denies spinal surgery  Therapeutic exercise:to centralize symptoms and improve ROM and strength required for successful completion of functional activities. - hip hikes off edge of stair with unilateral UE extremity,2x15 each side. With cuing for straight knee. To improve hip strength to better support knee in single leg stance activities such as walking. Intensive cuing and extra time to improve form to fatigue glute med.   - standing hip abduction at hip machine 2x10 each side 20# with tactile and verbal cues to prevent compensations. To improve hip strength for improved gait pattern.   Therapeutic  activities: for functional strengthening and improved functional activity tolerance. -Dynamic ambulationon treadmillat 1.0 mph 0% grade with SBA and close monitoring for safety. Cuing for improved gait cycle including heel strike, even gait, improved posture, tep length, and gait speed. x 5 min during subjective exam.  - step downs forward/ step up back from 6 inch step with unilateral support x 10 each with unilateral UE touch down support.  -Sit to stands withbuttocks tap on chair andencouragement for equal weight bearing on LE,x15 - single leg sit to stand modified with contralateral foot on 2kg ball,  to improve single leg activity tolerance, quad strength. Cuing for form. x10 each.  - ball toss with single leg stance CGA x 1 for safety with gait belt. To improve hip, knee, and single leg stance stability, endurance, and activity tolerance to improve gait and balance for functional activities.Cuing for lifting foot prior to tossing ball to get the most out of the activity. x10 each.  - standing split squats to improve quad strength and SLS stability x 10 each side with unilateral UE support. SBA for safety. Cuing for appropriate ROM.  - tandem walking x 100 feet to improve gait deviations including leaning and wide stance. SBA for safety. Cuing for proper form.  - dynamic stepping over 6 inch hurdles and back, forward, diagonal forward, lateral, diagonal back, back, and crossed back. To improve dynamic strength and balance. CGA and gait belt for safety. Cuing for proper execution.  - tandem forward and backward walking on aeromat. X 5 times over 4 foot stretch. Min A x 1 and CGA for safety. To reduce gait deviations.   HOME EXERCISE PROGRAM Access Code: [email or web address]  URL: [email or web address]  Date: 06/26/2018  Prepared by: Rosita Kea   Exercises   Supine Heel Slide with Strap - 10-15 reps - 5 second hold - 2-3 Sets - 2x daily - 7x weekly   Standing Terminal Knee  Extension at Wall with Ball - 10-15 reps - 5 second hold - 2 Sets - 1x daily - 7x weekly   Hooklying Straight Leg Raise - 10-15 reps - 1 second hold - 3 Sets - 1x daily - 3x weekly   Supine Hamstring Stretch with Strap - 3 reps - 30 second hold - 1 Sets - 1x daily - 3x weekly   Clamshell with Resistance - 10-15 reps - 1 second hold - 3 Sets - 1x daily - 3x weekly   Prone Terminal Knee Extension - 10-15 reps - 1 second hold - 3 Sets - 1x daily - 3x weekly   Supine Bridge with Resistance Band - 10-15 reps - 1 second hold - 3 Sets - 1x daily - 3x weekly   Standing Hip Hiking - 10-15 reps - 1 second hold -  3 Sets - 1x daily - 3x weekly   Toe Raises with Counter Support - 10-15 reps - 1 second hold - 3 Sets - 1x daily - 3x weekly   Toe Spreading - 20 reps - 3second hold - 1x daily - 3x weekly   Seated Lesser Toes Extension - 20 reps - 3 second hold - 1x daily - 3x weekly   Arch Lifting - 20 reps - 3 second hold - 1x daily - 3x weekly   Seated Great Toe Extension - 20 reps - 3 second hold - 1x daily - 3x weekly   Isometric Ankle Inversion - 20 reps - 5second hold - 1x daily - 3x weekly   Patient response to treatment:  Pt tolerated treatment well. She was able to progress weight bearing exercise intensity without increased pain. Patient continues to demonstrate signs of right hip weakness affecting her gait and hip hike form was further corrected to better isolate glute med as her tolerance for this exercises improves. Patient demo good understanding and technique. Ankle/foot exercises were also incorporated to contribute to improved knee stability.Pt required cuing for proper technique and to facilitate improved neuromuscular control, strength, range of motion, and functional abilitywith improvement following. PT Education - 06/29/18 1352    Education Details  exercise form/technique. Self-management advice    Person(s) Educated  Patient    Methods  Explanation;Demonstration;Tactile  cues;Verbal cues    Comprehension  Verbalized understanding;Returned demonstration          PT Short Term Goals - 05/17/18 0736      PT SHORT TERM GOAL #1   Title  Be independent with home exercise program completed at least 3 times per week for self-management of symptoms.    Baseline  Initial HEP provided at initial eval (05/04/2018);     Time  2    Period  Weeks    Status  Achieved    Target Date  05/18/18        PT Long Term Goals - 06/06/18 1138      PT LONG TERM GOAL #1   Title  Be independent with a long-term home exercise program for self-management of symptoms.     Baseline  Initial HEP provided at IE (05/04/2018); is independent with current HEP but has not yet received long term HEP (06/06/2018);    Time  6    Period  Weeks    Status  Partially Met    Target Date  07/18/18      PT LONG TERM GOAL #2   Title  Patient will demonstrate improved ability to perform functional tasks as exhibited by at least 10 point improvement in FOTO score    Baseline  Initial 44 (05/04/2018); 35 (06/06/2018);     Time  6    Period  Weeks    Status  On-going    Target Date  07/18/18      PT LONG TERM GOAL #3   Title  Patient will increase BLE gross strength to 4+/5 as to improve functional strength for independent gait, increased standing tolerance and increased ADL ability.    Baseline  see objective exam    Time  6    Period  Weeks    Status  Partially Met    Target Date  07/18/18      PT LONG TERM GOAL #4   Title  Have right knee AROM equal or greater than 0-125 with no increase in pain except intermittent end range discomfort  to allow patient to complete valued activities with less difficulty.     Baseline  -17-0-86 (ext- neutral- flex) (05/04/2018); -10-0-105 (06/06/2018)    Time  6    Period  Weeks    Status  Partially Met    Target Date  07/18/18      PT LONG TERM GOAL #5   Title  Complete community, work and/or recreational activities without limitation due to  current condition.     Baseline  unable to work, unable to keep grandchildren (05/04/2018); has not returned to work, will be helping with grandchildren over the next two weeks.  (06/06/2018)    Time  6    Period  Weeks    Status  Partially Met    Target Date  07/18/18            Plan - 06/30/18 1643    Clinical Impression Statement  Overall patient is making good progress towards goals at this point. She has demonstrated continued improved tolerance for work activities. She tolerated exercise progression today including split squats, balance and gait stability exercise, hip strengthening. Patient continues to be less tender to touch and pain is more localized near the anterior joint line/patellar tendon. She made significant knee flexion progress since initial eval. Pt continues to have ROM, pain, gait deviations, and activity tolerance restrictions that impact her daily activities. She would benefit from continued physical therapy interventions to work towards stated goals and return to PLOF.     Rehab Potential  Good    Clinical Impairments Affecting Rehab Potential  (+) motivation, goal to return to work; (-) chronicity of symptoms, history of failed PT    PT Frequency  2x / week    PT Duration  6 weeks    PT Treatment/Interventions  ADLs/Self Care Home Management;Cryotherapy;Aquatic Therapy;Electrical Stimulation;Moist Heat;Gait training;Stair training;Functional mobility training;Therapeutic activities;Therapeutic exercise;Balance training;Neuromuscular re-education;Patient/family education;Manual techniques;Scar mobilization;Passive range of motion;Dry needling;Taping;Spinal Manipulations;Joint Manipulations;Other (comment)    PT Next Visit Plan  continue progresive LE and functional stregthening as tolerated. Update HEP as appropriate.     PT Home Exercise Plan  Medbridge Access Code: [email or web address]     Consulted and Agree with Plan of Care  Patient       Patient will benefit from  skilled therapeutic intervention in order to improve the following deficits and impairments:  Abnormal gait, Decreased endurance, Decreased mobility, Difficulty walking, Decreased range of motion, Decreased scar mobility, Impaired perceived functional ability, Obesity, Decreased activity tolerance, Decreased strength, Impaired flexibility, Pain, Postural dysfunction  Visit Diagnosis: Chronic pain of right knee  Difficulty in walking, not elsewhere classified     Problem List Patient Active Problem List   Diagnosis Date Noted  . Prehypertension 05/30/2018  . Borderline diabetes 04/25/2014  . Obesity (BMI 35.0-39.9 without comorbidity) 04/25/2014  . Pure hypercholesterolemia 04/25/2014    Nancy Nordmann, PT, DPT 06/30/2018, 4:44 PM  [facility] [address] [place], [address] Phone: [phone]   Fax:  [phone]  Name: Mariah Sutton MRN: [id number] Date of Birth: 1961-04-17

## 2018-07-04 ENCOUNTER — Ambulatory Visit: Payer: PRIVATE HEALTH INSURANCE | Admitting: Physical Therapy

## 2018-07-04 ENCOUNTER — Encounter: Payer: Self-pay | Admitting: Physical Therapy

## 2018-07-04 DIAGNOSIS — M25561 Pain in right knee: Principal | ICD-10-CM

## 2018-07-04 DIAGNOSIS — R262 Difficulty in walking, not elsewhere classified: Secondary | ICD-10-CM

## 2018-07-04 DIAGNOSIS — G8929 Other chronic pain: Secondary | ICD-10-CM

## 2018-07-04 NOTE — Therapy (Signed)
[facility] [address] [place], [address] Phone: [phone]   Fax:  [phone]  Physical Therapy Treatment  Patient Details  Name: Mariah Sutton MRN: [id number] Date of Birth: 12-Dec-1960 Referring Provider (PT):  Feliberto Gottron, [place]   Encounter Date: 07/04/2018  PT End of Session - 07/04/18 1630    Visit Number  15    Number of Visits  19    Date for PT Re-Evaluation  07/18/18    Authorization Type  [employer] reporting period from 06/06/2018    Authorization Time Period  Current cert period: 53/61/4431 - 07/18/2018 (last PN: 06/06/2018);    Authorization - Visit Number  9    Authorization - Number of Visits  10    PT Start Time  1630    PT Stop Time  1710    PT Time Calculation (min)  40 min    Equipment Utilized During Treatment  Gait belt    Activity Tolerance  Patient tolerated treatment well;Patient limited by fatigue;Patient limited by pain    Behavior During Therapy  WFL for tasks assessed/performed       Past Medical History:  Diagnosis Date  . Bronchitis   . GERD (gastroesophageal reflux disease)   . High cholesterol   . Knee pain    Right  . Pneumonia   . Pre-diabetes     Past Surgical History:  Procedure Laterality Date  . BREAST CYST ASPIRATION Bilateral 2000  . CESAREAN SECTION    . KNEE ARTHROSCOPY WITH MEDIAL MENISECTOMY Right 04/13/2018   Procedure: KNEE ARTHROSCOPY WITH MEDIAL AND LATERAL MENISECTOMY;  Surgeon: Hessie Knows, MD;  Location: [facility];  Service: Orthopedics;  Laterality: Right;  . SYNOVECTOMY  04/13/2018   Procedure: Partial SYNOVECTOMY;  Surgeon: Hessie Knows, MD;  Location: [facility];  Service: Orthopedics;;    There were no vitals filed for this visit.  Subjective Assessment - 07/04/18 1633    Subjective  Patient reports 1/10 pain in the anterior right knee and bilateral foot pain upon arrival. She stepped on a rock when she got out of the van  yesterday and may have elevated her knee pain a bit. She reports soreness following last session, better by the next day close to lunch time. Soreness was in her legs. her HEP is going okay, still trying to change them up a little     Pertinent History  Patient is a 58 y.o. female who presents to outpatient physical therapy with a referral for s/p R knee arthroscopy, stiffness. This patient's chief complaints consist of pain, stiffness, altered gait pattern, leading to the following functional deficits: difficulty with ambulation and weight bearing activities. Relevant past medical history and comorbidities include borderline diabetes, obesity, history of cecerian section    Limitations  Lifting;Standing;Walking;House hold activities    How long can you sit comfortably?  no limitation    How long can you stand comfortably?  20-30 min    How long can you walk comfortably?  15-20 min    Diagnostic tests  prior to surgery (see chart)    Patient Stated Goals  get back to work, be able to stand and walk    Pain Onset  More than a month ago          PT Education - 07/04/18 1719    Education Details   Exercise purpose/form. Self management techniques. Education on diagnosis, prognosis, POC, anatomy and physiology of current condition  Person(s) Educated  Patient    Methods  Explanation;Demonstration;Tactile cues;Verbal cues    Comprehension  Verbalized understanding;Returned demonstration       TREATMENT: Denies sensitivity to latex Denies long term steroid use Denies spinal surgery  Therapeutic exercise:to centralize symptoms and improve ROM and strength required for successful completion of functional activities. -Dynamic ambulationon treadmillat up to 2.0 mph 0% grade with SBA andclose monitoring for safety. Cuing for improved gait cycle including heel strike, even gait, improved posture, tep length, and gait speed. x 5 min during subjective exam. - step downs forward/ step up  back from 8 inch step with unilateral supportx 10 each with unilateral UE support and CGA x 1 for safety.   - standing hip abduction at hip machine x15 each side 40# and x 15 each side 25 with tactile and verbal cues to prevent compensations. To improve hip strength for improved gait pattern.   Therapeutic activities: for functional strengthening and improved functional activity tolerance. -Sit to stands withbuttocks tap on chair andencouragement for equal weight bearing on LE,x15 - single leg sit to stand modified with contralateral foot on 2kg ball,  to improve single leg activity tolerance, quad strength. Cuing for form. 2x10 each. SBA x 1 for safety. - ball toss with single leg stance CGA x 1 for safety with gait belt.To improve hip, knee, and single leg stance stability, endurance, and activity tolerance to improve gait and balance for functional activities.Cuing for lifting foot prior to tossing ball to get the most out of the activity. 2x10 each.  - standing split squats to improve quad strength and SLS stability x 10 each side with unilateral UE support. SBA for safety. Cuing for appropriate ROM.  - tandem forward x 100 feet and 100 more feet on balls of feet..CGA for safety. To reduce gait deviations. .  - dynamic stepping over 6 inch hurdles and back, forward, diagonal forward, lateral, diagonal back, back, and crossed back, crossed front. To improve dynamic strength and balance. CGA and gait belt for safety. Cuing for proper execution. X 5 rounds each side.  HOME EXERCISE PROGRAM Access Code: [email or web address]  URL: [email or web address]  Date: 06/26/2018  Prepared by: Sara Snyder   Exercises   Supine Heel Slide with Strap - 10-15 reps - 5 second hold - 2-3 Sets - 2x daily - 7x weekly   Standing Terminal Knee Extension at Wall with Ball - 10-15 reps - 5 second hold - 2 Sets - 1x daily - 7x weekly   Hooklying Straight Leg Raise - 10-15 reps - 1 second hold - 3 Sets - 1x  daily - 3x weekly   Supine Hamstring Stretch with Strap - 3 reps - 30 second hold - 1 Sets - 1x daily - 3x weekly   Clamshell with Resistance - 10-15 reps - 1 second hold - 3 Sets - 1x daily - 3x weekly   Prone Terminal Knee Extension - 10-15 reps - 1 second hold - 3 Sets - 1x daily - 3x weekly   Supine Bridge with Resistance Band - 10-15 reps - 1 second hold - 3 Sets - 1x daily - 3x weekly   Standing Hip Hiking - 10-15 reps - 1 second hold - 3 Sets - 1x daily - 3x weekly   Toe Raises with Counter Support - 10-15 reps - 1 second hold - 3 Sets - 1x daily - 3x weekly   Toe Spreading - 20 reps - 3second hold - 1x daily - 3x weekly     Seated Lesser Toes Extension - 20 reps - 3 second hold - 1x daily - 3x weekly   Arch Lifting - 20 reps - 3 second hold - 1x daily - 3x weekly   Seated Great Toe Extension - 20 reps - 3 second hold - 1x daily - 3x weekly   Isometric Ankle Inversion - 20 reps - 5second hold - 1x daily - 3x weekly  Patient response to treatment:  Pt tolerated treatment well. She was able to continue with challenging weight bearing exercise intensity without increased pain.Patient continues to demonstrate signs of right hip weakness affecting her gait, however this is improving over time with strengthening. Patient demo good understanding and technique. Ankle/foot exercises were also incorporated to contribute to improved knee stability.Pt required cuing for proper technique and to facilitate improved neuromuscular control, strength, range of motion, and functional abilitywith improvement following.  .   PT Short Term Goals - 05/17/18 0736      PT SHORT TERM GOAL #1   Title  Be independent with home exercise program completed at least 3 times per week for self-management of symptoms.    Baseline  Initial HEP provided at initial eval (05/04/2018);     Time  2    Period  Weeks    Status  Achieved    Target Date  05/18/18        PT Long Term Goals - 06/06/18 1138       PT LONG TERM GOAL #1   Title  Be independent with a long-term home exercise program for self-management of symptoms.     Baseline  Initial HEP provided at IE (05/04/2018); is independent with current HEP but has not yet received long term HEP (06/06/2018);    Time  6    Period  Weeks    Status  Partially Met    Target Date  07/18/18      PT LONG TERM GOAL #2   Title  Patient will demonstrate improved ability to perform functional tasks as exhibited by at least 10 point improvement in FOTO score    Baseline  Initial 44 (05/04/2018); 35 (06/06/2018);     Time  6    Period  Weeks    Status  On-going    Target Date  07/18/18      PT LONG TERM GOAL #3   Title  Patient will increase BLE gross strength to 4+/5 as to improve functional strength for independent gait, increased standing tolerance and increased ADL ability.    Baseline  see objective exam    Time  6    Period  Weeks    Status  Partially Met    Target Date  07/18/18      PT LONG TERM GOAL #4   Title  Have right knee AROM equal or greater than 0-125 with no increase in pain except intermittent end range discomfort to allow patient to complete valued activities with less difficulty.     Baseline  -17-0-86 (ext- neutral- flex) (05/04/2018); -10-0-105 (06/06/2018)    Time  6    Period  Weeks    Status  Partially Met    Target Date  07/18/18      PT LONG TERM GOAL #5   Title  Complete community, work and/or recreational activities without limitation due to current condition.     Baseline  unable to work, unable to keep grandchildren (05/04/2018); has not returned to work, will be helping with grandchildren over the next two   weeks.  (06/06/2018)    Time  6    Period  Weeks    Status  Partially Met    Target Date  07/18/18            Plan - 07/04/18 1720    Clinical Impression Statement  Overall patient is making good progress towards goals at this point. She has demonstrated continued improved tolerance for work  activities. She tolerated exercise progression today including split squats, balance and gait stability exercise, hip strengthening. Patient continues to show gait deviations related to right hip weakness that is gradually improving and may be ready for discharge in the next couple of weeks. She made significant knee flexion progress since initial eval. Pt continues to have ROM, pain, gait deviations, and activity tolerance restrictions that impact her daily activities. She would benefit from continued physical therapy interventions to work towards stated goals and return to PLOF    Rehab Potential  Good    Clinical Impairments Affecting Rehab Potential  (+) motivation, goal to return to work; (-) chronicity of symptoms, history of failed PT    PT Frequency  2x / week    PT Duration  6 weeks    PT Treatment/Interventions  ADLs/Self Care Home Management;Cryotherapy;Aquatic Therapy;Electrical Stimulation;Moist Heat;Gait training;Stair training;Functional mobility training;Therapeutic activities;Therapeutic exercise;Balance training;Neuromuscular re-education;Patient/family education;Manual techniques;Scar mobilization;Passive range of motion;Dry needling;Taping;Spinal Manipulations;Joint Manipulations;Other (comment)    PT Next Visit Plan  continue progresive LE and functional stregthening as tolerated. Update HEP for long term.     PT Home Exercise Plan  Medbridge Access Code: [email or web address]     Consulted and Agree with Plan of Care  Patient       Patient will benefit from skilled therapeutic intervention in order to improve the following deficits and impairments:  Abnormal gait, Decreased endurance, Decreased mobility, Difficulty walking, Decreased range of motion, Decreased scar mobility, Impaired perceived functional ability, Obesity, Decreased activity tolerance, Decreased strength, Impaired flexibility, Pain, Postural dysfunction  Visit Diagnosis: Chronic pain of right knee  Difficulty in walking, not  elsewhere classified     Problem List Patient Active Problem List   Diagnosis Date Noted  . Prehypertension 05/30/2018  . Borderline diabetes 04/25/2014  . Obesity (BMI 35.0-39.9 without comorbidity) 04/25/2014  . Pure hypercholesterolemia 04/25/2014    Nancy Nordmann, PT, DPT 07/04/2018, 5:23 PM  [facility] [address], [place], [address] Phone: [phone]   Fax:  [phone]  Name: FRANCEEN ERISMAN MRN: [id number] Date of Birth: [birth year]

## 2018-07-11 ENCOUNTER — Ambulatory Visit: Payer: PRIVATE HEALTH INSURANCE | Admitting: Physical Therapy

## 2018-07-11 ENCOUNTER — Encounter: Payer: Self-pay | Admitting: Physical Therapy

## 2018-07-11 DIAGNOSIS — G8929 Other chronic pain: Secondary | ICD-10-CM

## 2018-07-11 DIAGNOSIS — M25561 Pain in right knee: Secondary | ICD-10-CM | POA: Diagnosis not present

## 2018-07-11 DIAGNOSIS — R262 Difficulty in walking, not elsewhere classified: Secondary | ICD-10-CM

## 2018-07-11 NOTE — Therapy (Addendum)
[facility] [address], [place], [address] Phone: [phone]   Fax:  [phone]  Physical Therapy Treatment and Discharge Summary Reporting Period: 05/04/2018 - 07/11/2018  Patient Details  Name: Mariah Sutton MRN: [id number] Date of Birth: [birth year] Referring Provider (PT):  Feliberto Gottron, [place]   Encounter Date: 07/11/2018  PT End of Session - 07/11/18 1729    Visit Number  16    Number of Visits  19    Date for PT Re-Evaluation  07/18/18    Authorization Type  MED[employer]ST reporting period from 06/06/2018    Authorization Time Period  Current cert period: 60/47/9987 - 07/18/2018 (last PN: 06/06/2018);    Authorization - Visit Number  10    Authorization - Number of Visits  10    PT Start Time  2158    PT Stop Time  1800    PT Time Calculation (min)  40 min    Equipment Utilized During Treatment  Gait belt    Activity Tolerance  Patient tolerated treatment well;Patient limited by fatigue;Patient limited by pain    Behavior During Therapy  WFL for tasks assessed/performed       Past Medical History:  Diagnosis Date  . Bronchitis   . GERD (gastroesophageal reflux disease)   . High cholesterol   . Knee pain    Right  . Pneumonia   . Pre-diabetes     Past Surgical History:  Procedure Laterality Date  . BREAST CYST ASPIRATION Bilateral 2000  . CESAREAN SECTION    . KNEE ARTHROSCOPY WITH MEDIAL MENISECTOMY Right 04/13/2018   Procedure: KNEE ARTHROSCOPY WITH MEDIAL AND LATERAL MENISECTOMY;  Surgeon: Hessie Knows, MD;  Location: [facility] ORS;  Service: Orthopedics;  Laterality: Right;  . SYNOVECTOMY  04/13/2018   Procedure: Partial SYNOVECTOMY;  Surgeon: Hessie Knows, MD;  Location: [facility] ORS;  Service: Orthopedics;;    There were no vitals filed for this visit.  Subjective Assessment - 07/11/18 1724    Subjective  Patient reports 3/10 pain in anterior right knee and bilateral foot pain (6/10)  that she attributes to working on hard floors. She recently went to see her surgeon who gave her an anti-inflammatory for her continued knee pain. She reports she is having a bit of a rough time at work that she attributes to the repetative bending and hard concrete floors and she is tired today, but she feels that she is ready for discharge from skilled physical therapy. She has no questions about her home exercise program and continues to try to do them doing the week one time per day and 2x per day on the weekends.     Pertinent History  Patient is a 58 y.o. female who presents to outpatient physical therapy with a referral for s/p R knee arthroscopy, stiffness. This patient's chief complaints consist of pain, stiffness, altered gait pattern, leading to the following functional deficits: difficulty with ambulation and weight bearing activities. Relevant past medical history and comorbidities include borderline diabetes, obesity, history of cecerian section    Limitations  Lifting;Standing;Walking;House hold activities    How long can you sit comfortably?  no limitation    How long can you stand comfortably?  7-8 hours    How long can you walk comfortably?  15 min    Diagnostic tests  prior to surgery (see chart)    Patient Stated Goals  get back to work, be able to stand and walk  Currently in Pain?  Yes    Pain Score  3     Pain Location  Knee    Pain Orientation  Right    Pain Descriptors / Indicators  Aching    Pain Type  Surgical pain    Pain Radiating Towards  bilateral feet    Pain Onset  More than a month ago    Pain Frequency  Intermittent    Aggravating Factors   basically concrete and long walking    Pain Relieving Factors  ice packs    Effect of Pain on Daily Activities  continues to have some pain during work         [facility] PT Assessment - 07/11/18 0001      Prior Function   Level of Independence  Independent    Vocation  Full time employment    Vocation Requirements    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       Cognition   Overall Cognitive Status  Within Functional Limits for tasks assessed      Observation/Other Assessments   Observations  see note from 07/11/2018 for latest objective data    Focus on Therapeutic Outcomes (FOTO)   42       OBJECTIVE: OBSERVATION/INSPECTION: Patient presents withclosed insicion sites with no excessive redness, swelling, or heat.   PERIPHERAL JOINT MOTION (AROM/PROM in degrees):  *Indicates pain   Hipgrossly WFL bilaterally, except loss of extension. Knee  Flexion: R =105/110* firm, L =122/125 firm end feel.   Extension: R =-5*, L =10.   STRENGTH:  *Indicates pain Hip  Flexion: R =5/5, L =5/5.  Extension:R =4+/5, L =4+/5.  Abduction: R =4/5, L =4+/5. Knee  Ext: R =5-/5*, L =5/5.  Flex: R =5-/5, L =5/5. Ankle (seated position)grossly WNL and equal bilaterally   PALPATION:  TTP anterior, medial joint line  FUNCTIONAL MOBILITY:  Bed mobility:supine <> sit I.  Transfers:sit <> stand I   Gait:ambulates with antalgic gait favoring right LE.  Stairs:four 6-inch steps, ascends step over step with touchdown UE support, descends step together pattern leading with left (more painful leading with right) and U UE support.    TREATMENT: Denies sensitivity to latex Denies long term steroid use Denies spinal surgery  Therapeutic exercise:to centralize symptoms and improve ROM and strength required for successful completion of functional activities. -Dynamic ambulationon treadmillat up to 1.7 mph 0% grade with SBA andclose monitoring for safety. Cuing for improved gait cycle including heel strike, even gait, improved posture, tep length, and gait speed. x 5 min during subjective exam. - standing hip abduction with narrow BUE  support with yellow theraband looped around ankles 2x 10 each side with tactile and verbal cues to prevent compensations. To improve hip strength for improved gait pattern.  - lateral step downs from 6 iinch step with bilateral UE supportx 10 each with unilateral UE support and CGA x 1 for safety. - Education on HEP including handout   - examination to assess progress at discharge (see above)  Therapeutic activities: for functional strengthening and improved functional activity tolerance. -Sit to stands withbuttocks tap onchairandencouragement for equal weight bearing on LE, 3x15 - ball toss with single leg stanceCGA x 1 for safety with gait belt.To improve hip, knee, and single leg stance stability, endurance, and activity tolerance to improve gait and balance for functional activities.Cuing  for lifting foot prior to tossing ball to get the most out of the activity.2x10 each.  HOME EXERCISE PROGRAM Access Code: [email or web address]  URL: [email or web address]  Date: 07/11/2018  Prepared by: Rosita Kea   Exercises  Sit to Stand without Arm Support - 10-15 reps - 1 second hold - 3 Sets - 1x daily - 3x weekly  Supine Heel Slide with Strap - 10-15 reps - 5 second hold - 2-3 Sets - 2x daily - 7x weekly  Supine Hamstring Stretch with Strap - 3 reps - 30 second hold - 1 Sets - 1x daily - 3x weekly  Standing Hip Abduction with Resistance at Ankles and Counter Support - 10-15 reps - 1 second hold - 3 Sets - 1x daily - 3x weekly  Supine Bridge with Resistance Band - 10-15 reps - 1 second hold - 3 Sets - 1x daily - 3x weekly  Standing Hip Hiking - 10-15 reps - 1 second hold - 3 Sets - 1x daily - 3x weekly  Lateral Step Down - 10-15 reps - 1 second hold - 3 Sets - 1x daily - 3x weekly   Patient response to treatment:  Pt tolerated treatment fair. She arrived with slightly elevated knee pain today and tired after working all day that required slight regressions of exercises. Patient  continues to demonstrate signs of right hip weakness affecting her gait, however this is improving over time with strengthening.Patient demo good understanding and technique.Pt required cuing for proper technique and to facilitate improved neuromuscular control, strength, range of motion, and functional abilitywith improvement following.     PT Education - 07/11/18 1729    Education Details  Exercise purpose/form. Self management techniques. Education on diagnosis, prognosis, POC, anatomy and physiology of current condition       PT Short Term Goals - 05/17/18 0736      PT SHORT TERM GOAL #1   Title  Be independent with home exercise program completed at least 3 times per week for self-management of symptoms.    Baseline  Initial HEP provided at initial eval (05/04/2018);     Time  2    Period  Weeks    Status  Achieved    Target Date  05/18/18        PT Long Term Goals - 07/11/18 1729      PT LONG TERM GOAL #1   Title  Be independent with a long-term home exercise program for self-management of symptoms.     Baseline  Initial HEP provided at IE (05/04/2018); is independent with current HEP but has not yet received long term HEP (06/06/2018);    Time  6    Period  Weeks    Status  Achieved    Target Date  07/18/18      PT LONG TERM GOAL #2   Title  Patient will demonstrate improved ability to perform functional tasks as exhibited by at least 10 point improvement in FOTO score    Baseline  Initial 44 (05/04/2018); 35 (06/06/2018); 42 (07/11/2018);     Time  6    Period  Weeks    Status  Not Met    Target Date  07/18/18      PT LONG TERM GOAL #3   Title  Patient will increase BLE gross strength to 4+/5 as to improve functional strength for independent gait, increased standing tolerance and increased ADL ability.    Baseline  see objective exam    Time  6  Period  Weeks    Status  Partially Met    Target Date  07/18/18      PT LONG TERM GOAL #4   Title  Have right  knee AROM equal or greater than 0-125 with no increase in pain except intermittent end range discomfort to allow patient to complete valued activities with less difficulty.     Baseline  -17-0-86 (ext- neutral- flex) (05/04/2018); -10-0-105 (06/06/2018); -5-0-110 (07/11/2018)    Time  6    Period  Weeks    Status  Partially Met    Target Date  07/18/18      PT LONG TERM GOAL #5   Title  Complete community, work and/or recreational activities without limitation due to current condition.     Baseline  unable to work, unable to keep grandchildren (05/04/2018); has not returned to work, will be helping with grandchildren over the next two weeks.  (06/06/2018); has returned to work and is able to play with grandchildren    Time  6    Period  Weeks    Status  Achieved    Target Date  07/18/18            Plan - 07/11/18 1819    Clinical Impression Statement  Patient has attended 16 treatment sessions this episode of care and has made steady progress towards most goals. She has met or partially met most goals. She has returned to working full time and appears to have achieved maximal functional improvement at this point and is more limited by her feet than her knee. Patient continues to show gait deviations related to right hip weakness and has been provided with a robust HEP to continue working toward improved gait pattern. Patient is now being discharged from physical therapy due to return to usual activities and leveling off of overall progress.     Rehab Potential  Good    Clinical Impairments Affecting Rehab Potential  (+) motivation, goal to return to work; (-) chronicity of symptoms, history of failed PT    PT Frequency  2x / week    PT Duration  6 weeks    PT Treatment/Interventions  ADLs/Self Care Home Management;Cryotherapy;Aquatic Therapy;Electrical Stimulation;Moist Heat;Gait training;Stair training;Functional mobility training;Therapeutic activities;Therapeutic exercise;Balance  training;Neuromuscular re-education;Patient/family education;Manual techniques;Scar mobilization;Passive range of motion;Dry needling;Taping;Spinal Manipulations;Joint Manipulations;Other (comment)    PT Next Visit Plan  Discharged from physical therapy     PT [place] Access Code: [email or web address]     Consulted and Agree with Plan of Care  Patient       Patient will benefit from skilled therapeutic intervention in order to improve the following deficits and impairments:  Abnormal gait, Decreased endurance, Decreased mobility, Difficulty walking, Decreased range of motion, Decreased scar mobility, Impaired perceived functional ability, Obesity, Decreased activity tolerance, Decreased strength, Impaired flexibility, Pain, Postural dysfunction  Visit Diagnosis: Chronic pain of right knee  Difficulty in walking, not elsewhere classified     Problem List Patient Active Problem List   Diagnosis Date Noted  . Prehypertension 05/30/2018  . Borderline diabetes 04/25/2014  . Obesity (BMI 35.0-39.9 without comorbidity) 04/25/2014  . Pure hypercholesterolemia 04/25/2014    Nancy Nordmann, PT, DPT 07/11/2018, 6:36 PM  [facility] [address] [place], [address] Phone: [phone]   Fax:  [phone]  Name: Mariah Sutton MRN: [id number] Date of Birth: [birth year]

## 2018-07-17 ENCOUNTER — Ambulatory Visit: Payer: PRIVATE HEALTH INSURANCE | Admitting: Physical Therapy

## 2018-07-20 ENCOUNTER — Ambulatory Visit: Payer: PRIVATE HEALTH INSURANCE | Admitting: Physical Therapy

## 2018-07-24 ENCOUNTER — Ambulatory Visit: Payer: PRIVATE HEALTH INSURANCE | Admitting: Physical Therapy

## 2018-07-24 ENCOUNTER — Ambulatory Visit
Admit: 2018-07-24 | Discharge: 2018-07-24 | Payer: PRIVATE HEALTH INSURANCE | Attending: Obstetrics & Gynecology | Primary: Internal Medicine

## 2018-07-24 DIAGNOSIS — Z01419 Encounter for gynecological examination (general) (routine) without abnormal findings: Secondary | ICD-10-CM

## 2018-07-24 NOTE — Progress Notes (Signed)
Subjective:      Patient ID: Linda PrinceLisa Jacobs is a 58 y.o. female.    Patient is here for annual. Patient in menopause. Patient with rare pelvic cramping.     Gynecologic Exam         Review of Systems   Constitutional: Negative.    HENT: Negative.    Eyes: Negative.    Respiratory: Negative.    Cardiovascular: Negative.    Gastrointestinal: Negative.    Genitourinary: Positive for pelvic pain.   Musculoskeletal: Negative.    Skin: Negative.    Neurological: Negative.    Psychiatric/Behavioral: Negative.      Date of Birth [birth year]  Past Medical History:   Diagnosis Date   ??? Diabetes mellitus (HCC)    ??? High cholesterol    ??? HPV (human papilloma virus) infection      Past Surgical History:   Procedure Laterality Date   ??? CESAREAN SECTION     ??? LAPAROSCOPY      infertility     OB History   Gravida Para Term Preterm AB Living   1 1 1          SAB TAB Ectopic Molar Multiple Live Births             1      # Outcome Date GA Lbr Len/2nd Weight Sex Delivery Anes PTL Lv   1 Term 11/02/01    F [id number]-[id number]        Social History     Socioeconomic History   ??? Marital status: Married     Spouse name: Not on file   ??? Number of children: Not on file   ??? Years of education: Not on file   ??? Highest education level: Not on file   Occupational History   ??? Not on file   Social Needs   ??? Financial resource strain: Not on file   ??? Food insecurity:     Worry: Not on file     Inability: Not on file   ??? Transportation needs:     Medical: Not on file     Non-medical: Not on file   Tobacco Use   ??? Smoking status: Never Smoker   ??? Smokeless tobacco: Never Used   Substance and Sexual Activity   ??? Alcohol use: Yes     Comment: occ.   ??? Drug use: No   ??? Sexual activity: Never   Lifestyle   ??? Physical activity:     Days per week: Not on file     Minutes per session: Not on file   ??? Stress: Not on file   Relationships   ??? Social connections:     Talks on phone: Not on file     Gets together: Not on file     Attends religious service: Not on file     Active  member of club or organization: Not on file     Attends meetings of clubs or organizations: Not on file     Relationship status: Not on file   ??? Intimate partner violence:     Fear of current or ex partner: Not on file     Emotionally abused: Not on file     Physically abused: Not on file     Forced sexual activity: Not on file   Other Topics Concern   ??? Not on file   Social History Narrative   ??? Not on file     Allergies  Allergen Reactions   ??? Latex Rash     blisters   ??? Pcn [Penicillins] Hives     Outpatient Medications Marked as Taking for the 07/24/18 encounter (Office Visit) with Estill Batten, MD   Medication Sig Dispense Refill   ??? metFORMIN (GLUCOPHAGE) 500 MG tablet Take 500 g by mouth daily     ??? VICTOZA 18 MG/3ML SOPN SC injection Take 18 mg by mouth daily     ??? lisinopril (PRINIVIL;ZESTRIL) 5 MG tablet Take 5 mg by mouth daily     ??? FARXIGA 10 MG tablet Take 10 mg by mouth daily     ??? fluconazole (DIFLUCAN) 150 MG tablet Take 150 mg by mouth daily     ??? atorvastatin (LIPITOR) 20 MG tablet Take 20 mg by mouth daily       History reviewed. No pertinent family history.  BP (!) 124/57 (Site: Right Upper Arm, Position: Sitting, Cuff Size: Large Adult)    Pulse 81    Resp 17    Ht 5\' 9"  (1.753 m)    Wt 278 lb 6.4 oz (126.3 kg)    LMP  (LMP Unknown)    BMI 41.11 kg/m??       Objective:   Physical Exam  Constitutional:       General: She is not in acute distress.     Appearance: Normal appearance. She is well-developed and normal weight. She is not diaphoretic.   HENT:      Head: Normocephalic and atraumatic.      Nose: Nose normal.      Mouth/Throat:      Mouth: Mucous membranes are moist.      Pharynx: Oropharynx is clear.   Eyes:      Pupils: Pupils are equal, round, and reactive to light.   Neck:      Musculoskeletal: Normal range of motion and neck supple. No neck rigidity.      Thyroid: No thyromegaly.   Cardiovascular:      Rate and Rhythm: Normal rate and regular rhythm.      Heart sounds: Normal  heart sounds. No murmur. No friction rub. No gallop.    Pulmonary:      Effort: Pulmonary effort is normal. No respiratory distress.      Breath sounds: Normal breath sounds. No wheezing or rales.   Chest:      Breasts:         Right: Normal. No swelling, bleeding, inverted nipple, mass, nipple discharge, skin change or tenderness.         Left: Normal. No swelling, bleeding, inverted nipple, mass, nipple discharge, skin change or tenderness.   Abdominal:      General: Abdomen is flat. Bowel sounds are normal. There is no distension.      Palpations: Abdomen is soft. There is no hepatomegaly or mass.      Tenderness: There is no abdominal tenderness. There is no guarding or rebound.      Hernia: No hernia is present. There is no hernia in the right inguinal area or left inguinal area.   Genitourinary:     General: Normal vulva.      Exam position: Lithotomy position.      Pubic Area: No rash.       Labia:         Right: No rash, tenderness, lesion or injury.         Left: No rash, tenderness, lesion or injury.  Urethra: No prolapse, urethral pain, urethral swelling or urethral lesion.      Vagina: Normal. No signs of injury and foreign body. No vaginal discharge, erythema, tenderness or bleeding.      Cervix: No cervical motion tenderness, discharge, friability, lesion, erythema, cervical bleeding or eversion.      Uterus: Not deviated, not enlarged, not fixed, not tender and no uterine prolapse.       Adnexa:         Right: No mass, tenderness or fullness.          Left: No mass, tenderness or fullness.        Rectum: Normal. Guaiac result negative. No mass, tenderness, anal fissure, external hemorrhoid or internal hemorrhoid. Normal anal tone.      Comments: Normal urethral meatus, nl urethra, nl bladder.    Musculoskeletal: Normal range of motion.         General: No tenderness.   Lymphadenopathy:      Cervical: No cervical adenopathy.      Lower Body: No right inguinal adenopathy. No left inguinal  adenopathy.   Skin:     General: Skin is warm and dry.      Findings: No erythema or rash.   Neurological:      General: No focal deficit present.      Mental Status: She is alert and oriented to person, place, and time.      Deep Tendon Reflexes: Reflexes are normal and symmetric.   Psychiatric:         Mood and Affect: Mood normal.         Behavior: Behavior normal.         Thought Content: Thought content normal.         Judgment: Judgment normal.         Assessment:      1. Annual  2. Menopause  3. Pelvic cramping  4. Family hx ovarian cancer      Plan:      1. Pap, calcium, exercise, mammogram, hemocult negative  2. Stable  3-4-pelvic US, CA 125        Estill Batten, MD

## 2018-07-25 ENCOUNTER — Inpatient Hospital Stay: Admit: 2018-07-25 | Payer: PRIVATE HEALTH INSURANCE | Primary: Internal Medicine

## 2018-07-25 DIAGNOSIS — Z01419 Encounter for gynecological examination (general) (routine) without abnormal findings: Secondary | ICD-10-CM

## 2018-07-25 LAB — CA 125: CA 125: 5 U/mL (ref 0.0–35.0)

## 2018-07-26 LAB — HUMAN PAPILLOMAVIRUS (HPV) DNA PROBE THIN PREP HIGH RISK
HPV Genotype 16: NOT DETECTED
HPV Type 18: NOT DETECTED
HPVOH (OTHER TYPES): NOT DETECTED

## 2018-07-27 ENCOUNTER — Encounter: Payer: PRIVATE HEALTH INSURANCE | Admitting: Physical Therapy

## 2018-07-31 ENCOUNTER — Encounter: Payer: PRIVATE HEALTH INSURANCE | Admitting: Physical Therapy

## 2018-08-03 ENCOUNTER — Encounter: Payer: PRIVATE HEALTH INSURANCE | Admitting: Physical Therapy

## 2018-08-07 ENCOUNTER — Encounter: Payer: PRIVATE HEALTH INSURANCE | Admitting: Physical Therapy

## 2018-08-10 ENCOUNTER — Encounter: Payer: PRIVATE HEALTH INSURANCE | Admitting: Physical Therapy

## 2019-01-06 ENCOUNTER — Ambulatory Visit (INDEPENDENT_AMBULATORY_CARE_PROVIDER_SITE_OTHER)
Admission: RE | Admit: 2019-01-06 | Discharge: 2019-01-06 | Disposition: A | Discharge status: Home / Self Care | Payer: PRIVATE HEALTH INSURANCE | Source: Ambulatory Visit

## 2019-01-06 ENCOUNTER — Inpatient Hospital Stay
Admit: 2019-01-06 | Discharge: 2019-01-06 | Disposition: A | Discharge status: Home / Self Care | Payer: PRIVATE HEALTH INSURANCE

## 2019-01-06 DIAGNOSIS — K0889 Other specified disorders of teeth and supporting structures: Secondary | ICD-10-CM | POA: Diagnosis not present

## 2019-01-06 MED ORDER — AMOXICILLIN-POT CLAVULANATE 875-125 MG PO TABS: 1.0000 | ORAL_TABLET | Freq: Two times a day (BID) | ORAL | 0 refills | Status: DC

## 2019-01-06 NOTE — ED Provider Notes (Signed)
Virtual Visit via Video Note:  Mariah Sutton  initiated request for Telemedicine visit with [facility] team. I connected with Mariah Sutton  on 01/06/2019 at 3:53 PM  for a synchronized telemedicine visit using a video enabled HIPPA compliant telemedicine application. I verified that I am speaking with Mariah Sutton  using two identifiers. Mariah Sutton Jodell Cipro, PA-C  was physically located in a [facility]gent care site and Mariah Sutton was located at a different location.   The limitations of evaluation and management by telemedicine as well as the availability of in-person appointments were discussed. Patient was informed that she  may incur a bill ( including co-pay) for this virtual visit encounter. Mariah Sutton  expressed understanding and gave verbal consent to proceed with virtual visit.     History of Present Illness:Mariah Sutton  is a 58 y.o. female presents with 2 day history of right upper dental pain. She has had a known broken tooth that occurred about 1 year ago. About 2 days ago, started having pain to the area. Has mild swelling to the face without erythema, warmth. Denies swelling of the throat, trouble swallowing, tripoding, drooling, trismus. Has tenderness along the gum area. Denies fever, chills, night sweats. Has been taking BC powder without relief.   Past Medical History:  Diagnosis Date  . Bronchitis   . GERD (gastroesophageal reflux disease)   . High cholesterol   . Knee pain    Right  . Pneumonia   . Pre-diabetes     Allergies  Allergen Reactions  . Novocain [Procaine Hcl]     Eye swelling during dental procedure        Observations/Objective: General: Well appearing, nontoxic, no acute distress. Sitting comfortably. Head: Normocephalic, atraumatic Eye: No conjunctival injection, eyelid swelling. EOMI ENT: No obvious facial swelling. Mucus membranes moist, no lip cracking. No obvious nasal drainage. No trismus. Patient states floor of mouth soft to  palpation. Pulm: Speaking in full sentences without difficulty. Normal effort. No respiratory distress, accessory muscle use. Neuro: Normal mental status. Alert and oriented x 3.   Assessment and Plan: Start antibiotics for possible dental infection. Symptomatic treatment as needed. Discussed with patient symptoms can return if dental problem is not addressed. Follow up with dentist for further evaluation and treatment of dental pain. Resources given. Return precautions given.   Follow Up Instructions:    I discussed the assessment and treatment plan with the patient. The patient was provided an opportunity to ask questions and all were answered. The patient agreed with the plan and demonstrated an understanding of the instructions.   The patient was advised to call back or seek an in-person evaluation if the symptoms worsen or if the condition fails to improve as anticipated.  I provided 15 minutes of non-face-to-face time during this encounter.    Ok Edwards, PA-C  01/06/2019 3:53 PM         Ok Edwards, PA-C 01/06/19 1611

## 2019-01-06 NOTE — Discharge Instructions (Signed)
Start Augmentin as directed for dental infection. Follow up with dentist for further treatment and evaluation. If experiencing swelling of the throat, trouble breathing, trouble swallowing, leaning forward to breath, drooling, go to the emergency department for further evaluation.

## 2019-07-30 ENCOUNTER — Encounter: Attending: Obstetrics & Gynecology | Primary: Internal Medicine

## 2019-08-27 ENCOUNTER — Ambulatory Visit
Admit: 2019-08-27 | Discharge: 2019-08-27 | Payer: PRIVATE HEALTH INSURANCE | Attending: Obstetrics & Gynecology | Primary: Internal Medicine

## 2019-08-27 DIAGNOSIS — B379 Candidiasis, unspecified: Secondary | ICD-10-CM

## 2019-08-27 MED ORDER — FLUCONAZOLE 150 MG PO TABS
150 MG | ORAL_TABLET | ORAL | 3 refills | Status: DC
Start: 2019-08-27 — End: 2020-08-22

## 2019-08-28 LAB — WET PREP, GENITAL
Clue Cells, Wet Prep: NONE SEEN
Trichomonas Prep: NONE SEEN
Yeast, Wet Prep: NONE SEEN

## 2019-08-30 LAB — CULTURE, GENITAL: Genital Culture, Routine: NORMAL

## 2019-11-28 DIAGNOSIS — I10 Essential (primary) hypertension: Secondary | ICD-10-CM | POA: Insufficient documentation

## 2019-12-06 ENCOUNTER — Telehealth: Payer: Self-pay | Admitting: *Deleted

## 2019-12-06 ENCOUNTER — Encounter: Payer: Self-pay | Admitting: *Deleted

## 2019-12-06 NOTE — Telephone Encounter (Signed)
Received referral for low dose lung cancer screening CT scan. Message left at phone number listed in EMR for patient to call me back to facilitate scheduling scan.  

## 2020-01-14 ENCOUNTER — Inpatient Hospital Stay: Admit: 2020-01-14 | Payer: PRIVATE HEALTH INSURANCE | Primary: Internal Medicine

## 2020-01-14 ENCOUNTER — Encounter

## 2020-01-14 DIAGNOSIS — Z01419 Encounter for gynecological examination (general) (routine) without abnormal findings: Secondary | ICD-10-CM

## 2020-01-15 LAB — CA 125: CA 125: 4.7 U/mL (ref 0.0–35.0)

## 2020-01-15 NOTE — Telephone Encounter (Signed)
From: Joella Prince  To: Estill Batten, MD  Sent: 01/15/2020 7:01 AM EDT  Subject: Test Results Question    hi dr Yvette Rack  i have a question on the test results yesterday. should i be concerned that they can not locate the left ovary ?    thanks

## 2020-01-17 NOTE — Telephone Encounter (Signed)
Please save for Dr Yvette Rack

## 2020-01-19 ENCOUNTER — Telehealth: Payer: Self-pay | Admitting: *Deleted

## 2020-01-19 NOTE — Telephone Encounter (Signed)
RN notified that pt had called out of work this week citing URI sx. Epic review shows pt was seen and treated at [facility] on 8/4. She had HA, congestion, sore throat, fatigue and body aches that started 8/3. Pt is fully vaccinated per notes.  Rapid Covid test performed and resulted as negative. Clinic Rx'd Doxycycline and Coricidin HBP and provided work note that pt could RTW Monday 8/9. However pt was tested on Day 1 of symptoms. Recommend pt be tested during Days 3-5 and have negative covid test and sx improving prior to her returning to work.  Attempted to reach pt by phone, no answer. Awaiting call back.

## 2020-01-20 NOTE — Telephone Encounter (Signed)
Noted agree with plan of care per CDC and [place] [facility] guidelines testing recommended days 3-5 of symptoms.  Please review covid symptoms and red flags with patient also may return to work if symptoms improving and second test negative otherwise to quarantine for 10 days.  Encourage mask where when indoors around others, frequent handwashing and surface sanitizing.

## 2020-01-21 NOTE — Telephone Encounter (Signed)
Did not received return call this weekend from patient. Spoke with her this morning over the phone. She was at work today since 0600. Advised pt to leave campus and helped her schedule pcr testing online. No available appointments today. Pt scheduled for first available tomorrow 8/10 at 0930, Day 7 of sx. Pt reports still having mild sore throat and cough. Denies any new sx. Will plan to f/u with pt on 8/12 for anticipated results and RTW discussion.

## 2020-01-21 NOTE — Telephone Encounter (Signed)
Noted agree with plan of care 

## 2020-01-24 NOTE — Telephone Encounter (Signed)
Has patient received notification of her results?  Please verify if symptoms improving also.

## 2020-01-25 NOTE — Telephone Encounter (Signed)
Pt returned call. Sx are improved. Only sx remaining is frontal HA. She has stopped Coricidan HBP and is finishing Doxcycline course from pcp.  She reports 8/10 test results not back yet. She called [facility] for status and was told they would result sometime between today and Monday. Regardless of test, she is at Day 10 today, so she is cleared to RTW on next scheduled workday of Monday 8/16. HR made aware of same. Asked pt to still forward results to HR or RN once received.

## 2020-01-25 NOTE — Telephone Encounter (Signed)
Noted symptoms improving on day 10 of quarantine and test results covid still pending between now and Monday per patient.

## 2020-01-25 NOTE — Telephone Encounter (Signed)
Called pt this morning to check status of test result and sx check. No answer. LVMRCB. HR aware.

## 2020-01-29 NOTE — Telephone Encounter (Signed)
Please verify patient test results and RTW date.

## 2020-01-31 ENCOUNTER — Encounter: Payer: Self-pay | Admitting: *Deleted

## 2020-01-31 NOTE — Telephone Encounter (Signed)
Noted RTW completed quarantine.

## 2020-01-31 NOTE — Telephone Encounter (Signed)
Pt did not receive test results due to [facility] system issues but she did complete 10 day quarantine and returned 02/04/20.

## 2020-02-15 ENCOUNTER — Telehealth: Payer: Self-pay

## 2020-02-15 NOTE — Telephone Encounter (Signed)
Contacted patient today for low dose CT screening clinic after receiving referral from Dr. Burnadette Pop.  Patient remembers her doctor reviewing the program with her.  I explained the program and she is agreeable to enroll.  CT scan scheduled for Sept 21 at 1:15.  Patient is a former smoker having quit 7 years ago.  She started smoking at age 59.  When she smoked she smoking 100% of 1 pack and around 50% of a second pack.  She confirms her current insurance is [employer].  I have given her address to Outpatient imaging center and phone number to Glenna Fellows, lung navigator.

## 2020-02-22 ENCOUNTER — Telehealth: Payer: Self-pay | Admitting: *Deleted

## 2020-02-22 ENCOUNTER — Other Ambulatory Visit: Payer: PRIVATE HEALTH INSURANCE

## 2020-02-22 ENCOUNTER — Other Ambulatory Visit: Payer: Self-pay | Admitting: Critical Care Medicine

## 2020-02-22 DIAGNOSIS — Z20822 Contact with and (suspected) exposure to covid-19: Secondary | ICD-10-CM

## 2020-02-22 NOTE — Telephone Encounter (Signed)
RN was notified by Scientist, water quality that pt had reported a Covid exposure. RN discussed with pt in clinic. She sts her adult daughter that lives in the same household with her started having Covid sx last Saturday 9/4. She was tested 9/6 and received a positive result today 9/10. Pt has tried to isolate from daughter as much as possible but small home. She has been sanitizing and cleaning frequently.   Pt is fully vaccinated. She is asymptomatic. She may continue working on-site, wearing mask, practicing social distancing and washing hands frequently. Set up for testing as per policy for exposed vaxxed employees. Day 4 from daughter's test date. Appt made at [facility] testing site at [facility] for today at 1345. Pt will be able to return to work while pending test result. If positive, pt notified not to report to work and Charity fundraiser will contact her to discuss quarantine.   Pt's 2 grandsons in same household require testing to be able to return to school. Appts made for them at pt's request as well for same time and date.  Will plan to f/u with pt on Monday 9/13. Pt reminded to contact clinic and leave work if she becomes symptomatic. Pt verbalizes understanding and agreement with plan of care. No further questions/concerns.

## 2020-02-22 NOTE — Telephone Encounter (Signed)
Noted agree with plan of care testing today and continue to monitor for covid 19 symptoms.  Notify clinic staff if symptoms occur and start quarantine.  Wear mask at work/when out in public, frequent hand washing and sanitize/clean high touch surfaces at home.

## 2020-02-25 LAB — NOVEL CORONAVIRUS, NAA: SARS-CoV-2, NAA: NOT DETECTED

## 2020-02-26 ENCOUNTER — Encounter: Payer: Self-pay | Admitting: *Deleted

## 2020-02-26 ENCOUNTER — Other Ambulatory Visit: Payer: Self-pay | Admitting: *Deleted

## 2020-02-26 DIAGNOSIS — Z122 Encounter for screening for malignant neoplasm of respiratory organs: Secondary | ICD-10-CM

## 2020-02-26 DIAGNOSIS — Z87891 Personal history of nicotine dependence: Secondary | ICD-10-CM

## 2020-02-26 NOTE — Telephone Encounter (Signed)
Patient brought her cell phone to show clinic staff her PCR test negative.  Her sister's PCR test came back positive and daughter is quarantining in same room as positive daughter.  1 grandson negative and the other still pending test results.  Patient to continue mask wear, separated from daughter and sister at home, washing hands frequently and sanitizing high touch areas.  Patient to continue monitoring for covid systems e.g. runny nose, sore throat, headache, loss of taste/smell, nausea/vomiting/diarrhea, fever/chills, body aches, cough, shortness of breath/dyspnea.  Patient denied any symptoms at this time.  Patient to notify clinic staff if symptoms develop and supervisor to go home to quarantine and retest. HR Tim notified by Johnna Acosta patient test negative and as employee fully vaccinated no quarantine required at this time.  Continue to protect self with mask wear, hand sanitizing washing, avoid touching face and maintaining social distancing 6 feet or greater.  Patient verbalized understanding of information/instructions, agreed with plan of care and had no further questions at this time.

## 2020-03-04 ENCOUNTER — Inpatient Hospital Stay: Payer: PRIVATE HEALTH INSURANCE | Attending: Nurse Practitioner | Admitting: Oncology

## 2020-03-04 ENCOUNTER — Encounter: Payer: Self-pay | Admitting: Oncology

## 2020-03-04 ENCOUNTER — Other Ambulatory Visit: Payer: Self-pay

## 2020-03-04 ENCOUNTER — Ambulatory Visit
Admission: RE | Admit: 2020-03-04 | Discharge: 2020-03-04 | Disposition: A | Discharge status: Home / Self Care | Payer: PRIVATE HEALTH INSURANCE | Source: Ambulatory Visit | Attending: Nurse Practitioner | Admitting: Nurse Practitioner

## 2020-03-04 DIAGNOSIS — Z87891 Personal history of nicotine dependence: Secondary | ICD-10-CM | POA: Diagnosis present

## 2020-03-04 DIAGNOSIS — Z122 Encounter for screening for malignant neoplasm of respiratory organs: Secondary | ICD-10-CM

## 2020-03-04 NOTE — Progress Notes (Signed)
Virtual Visit via Video Note  I connected with Mariah Sutton on 03/04/20 at  1:15 PM EDT by a video enabled telemedicine application and verified that I am speaking with the correct person using two identifiers.  Location: Patient: [facility] Provider: Clinic    I discussed the limitations of evaluation and management by telemedicine and the availability of in person appointments. The patient expressed understanding and agreed to proceed.  I discussed the assessment and treatment plan with the patient. The patient was provided an opportunity to ask questions and all were answered. The patient agreed with the plan and demonstrated an understanding of the instructions.   The patient was advised to call back or seek an in-person evaluation if the symptoms worsen or if the condition fails to improve as anticipated.   In accordance with CMS guidelines, patient has met eligibility criteria including age, absence of signs or symptoms of lung cancer.  Social History   Tobacco Use  . Smoking status: Former Smoker    Packs/day: 1.50    Years: 36.00    Pack years: 54.00    Types: Cigarettes    Quit date: 2016    Years since quitting: 5.7  . Smokeless tobacco: Never Used  Vaping Use  . Vaping Use: Never used  Substance Use Topics  . Alcohol use: No  . Drug use: No      A shared decision-making session was conducted prior to the performance of CT scan. This includes one or more decision aids, includes benefits and harms of screening, follow-up diagnostic testing, over-diagnosis, false positive rate, and total radiation exposure.   Counseling on the importance of adherence to annual lung cancer LDCT screening, impact of co-morbidities, and ability or willingness to undergo diagnosis and treatment is imperative for compliance of the program.   Counseling on the importance of continued smoking cessation for former smokers; the importance of smoking cessation for current smokers, and information about  tobacco cessation interventions have been given to patient including [place] and [employer] quit [place] programs.   Written order for lung cancer screening with LDCT has been given to the patient and any and all questions have been answered to the best of my abilities.    Yearly follow up will be coordinated by Burgess Estelle, Thoracic Navigator.  I provided 15 minutes of face-to-face video visit time during this encounter, and > 50% was spent counseling as documented under my assessment & plan.   Jacquelin Hawking, NP

## 2020-03-07 ENCOUNTER — Telehealth: Payer: Self-pay | Admitting: *Deleted

## 2020-03-07 NOTE — Telephone Encounter (Signed)
Notified patient of LDCT lung cancer screening program results with recommendation for 6 month follow up imaging. Also notified of incidental findings noted below and is encouraged to discuss further with PCP who will receive a copy of this note and/or the CT report. Patient verbalizes understanding.   MPRESSION: 1. Lung-RADS 3, probably benign findings. Dominant 6.3 mm left upper lobe pulmonary nodule. Short-term follow-up in 6 months is recommended with repeat low-dose chest CT without contrast (please use the following order, "CT CHEST LCS NODULE FOLLOW-UP W/O CM"). 2. Two-vessel coronary atherosclerosis. 3. Small hiatal hernia. 4. Cholelithiasis. 5. Aortic Atherosclerosis (ICD10-I70.0) and Emphysema (ICD10-J43.9).

## 2020-03-12 DIAGNOSIS — I2510 Atherosclerotic heart disease of native coronary artery without angina pectoris: Secondary | ICD-10-CM | POA: Insufficient documentation

## 2020-03-12 DIAGNOSIS — J439 Emphysema, unspecified: Secondary | ICD-10-CM | POA: Insufficient documentation

## 2020-03-31 ENCOUNTER — Telehealth: Payer: Self-pay | Admitting: *Deleted

## 2020-03-31 NOTE — Telephone Encounter (Signed)
RN notified that pt left work after one hour this morning. C/o headache and dizziness. Spoke with pt over phone. She reports same sx but also sinus pain/pressure since 10/16. Seen at walk in clinic at [place] today. They performed covid testing and Dx her with acute bacterial sinus infection. Started on Doxycycline, Tessalon pearles, and Tussionex. She was given a note for work to stay out "for a few days" per pt while awaiting PCR test results. Details of note not visible in Care Everywhere.  Plan for 7 day quarantine if test negative.   Last day on-site: 03/31/20 Day 1 of Sx: 03/29/20 Day 1 of quarantine: 03/30/20 Testing between sx days 3-5 Test completed at [facility] in clinic on 10/18 Day 7 quarantine: 04/05/20 RTW date: 04/07/20

## 2020-04-01 NOTE — Telephone Encounter (Signed)
Noted agree with plan of care 

## 2020-04-03 ENCOUNTER — Encounter: Payer: Self-pay | Admitting: *Deleted

## 2020-04-03 NOTE — Telephone Encounter (Signed)
Late entry telephone call with patient yesterday duration 7 minutes audio only.  Patient received her covid PCR test results negative Tuesday night via [employer] reviewed and confirmed rapid POC and PCR [facility] tests both negative for flu/covid.  Symptoms almost all resolved with medication from [facility] clinic just getting a headache before next dose of antibiotic/pain medication as it wears off.  Patient reported she called [facility] to obtain work excuse for yesterday 04/02/2020 as could not return to work without PCR test result negative.  Patient asked if I am going to clear her.  I stated I would talk with HR as symptoms rapidly improved with antibiotic for sinusitis and cough most likely related to post nasal drip from sinusitis.  Patient spoke full sentences without difficulty A&Ox3 no throat/nasal clearing noted during telephone conversation.  Respirations even and unlabored no cough noted during telephone conversation.  Patient feels ready to return to work.  Patient denied fever/chills/n/v/d in previous 24 hours.  Patient denied new symptoms or known covid contacts.  Discussed with patient I would notify HR of my recommendations but she cannot return onsite until cleared by HR/supervisor to return onsite.  Patient to bring work excuse from her [place] clinic provider to supervisor when she does return.  Patient full pfizer covid vaccinated.  Message sent to [employer] HR team with amended RTW recommendation for today 04/03/2020.  Discussed I can be reached via my chart or [email or web address] if further questions and RN Rolly Salter and I in clinic Thursday [phone].  Patient verbalized understanding information/instructions and had no further questions at this time.  Please verify with HR if patient was instructed still had to complete 7 day quarantine or was cleared to RTW earlier date 04/04/2020.

## 2020-04-04 NOTE — Telephone Encounter (Signed)
Cleared with HR Tim to RTW today, or next scheduled work day. Attempted to inform pt. No answer by phone. LVMRCB.

## 2020-04-04 NOTE — Telephone Encounter (Signed)
Noted patient RTW still having some nasal congestion cleared by HR to return today and patient seen in clinic given nasal saline by RN Rolly Salter from clinic stock.

## 2020-04-04 NOTE — Telephone Encounter (Signed)
Pt in to clinic. She RTW yesterday 10/21 after talking with Inetta Fermo NP Wednesday 10/20. She reports only current sx still some nasal congestion. No sinus pain or pressure. Provided pt with nasal saline bottle to use at work q2h and as nasal rinse at night in shower as directed. Pt verbalizes understanding and agreement. No further questions/concerns.

## 2020-07-08 ENCOUNTER — Encounter: Payer: Self-pay | Admitting: Registered Nurse

## 2020-07-08 ENCOUNTER — Ambulatory Visit: Payer: Self-pay | Admitting: Registered Nurse

## 2020-07-08 ENCOUNTER — Other Ambulatory Visit: Payer: Self-pay

## 2020-07-08 VITALS — BP 147/88 | HR 84 | Temp 97.3°F

## 2020-07-08 DIAGNOSIS — K5732 Diverticulitis of large intestine without perforation or abscess without bleeding: Secondary | ICD-10-CM

## 2020-07-08 DIAGNOSIS — K2100 Gastro-esophageal reflux disease with esophagitis, without bleeding: Secondary | ICD-10-CM

## 2020-07-08 DIAGNOSIS — I10 Essential (primary) hypertension: Secondary | ICD-10-CM

## 2020-07-08 DIAGNOSIS — M7712 Lateral epicondylitis, left elbow: Secondary | ICD-10-CM

## 2020-07-08 DIAGNOSIS — M1712 Unilateral primary osteoarthritis, left knee: Secondary | ICD-10-CM

## 2020-07-08 DIAGNOSIS — M62838 Other muscle spasm: Secondary | ICD-10-CM

## 2020-07-08 MED ORDER — CYCLOBENZAPRINE HCL 10 MG PO TABS: 5.0000 mg | ORAL_TABLET | Freq: Three times a day (TID) | ORAL | 0 refills | Status: AC | PRN

## 2020-07-08 MED ORDER — BIOFREEZE 4 % EX GEL: 1.0000 "application " | Freq: Four times a day (QID) | CUTANEOUS | 0 refills | Status: AC | PRN

## 2020-07-08 MED ORDER — AMOXICILLIN-POT CLAVULANATE 875-125 MG PO TABS: 1.0000 | ORAL_TABLET | Freq: Three times a day (TID) | ORAL | 0 refills | Status: AC

## 2020-07-08 NOTE — Progress Notes (Signed)
Subjective:    Patient ID: Mariah Sutton, female    DOB: [birth year], 60 y.o.   MRN: [id number]  59y/o caucasian female established patient here for bilateral hand pain/cramps and LUQ abdomen pain, bloating for a couple days now.  Pain travels around abdomen and sometimes she feels pain in her back at same level as abdomen pain.  Stools a little looser than usual but only emptying 50% usual amount per day.  Denied vomiting/diarrhea, coffee ground or bright red blood in stools.  Had colonoscopy a few years ago polyps and diverticulosis noted.  PO intake without difficulty.  Denied fever/chills.  Has tried pickle juice, mustard and voltaren gel for hand arthritis and muscle spasms.   Once her fingers cramp up the muscle spasms travel up her arm left greater than right.  Denied numbness/tingling/weakness/injury/trauma.  Patient uses left hand mostly at work (her dominant for work tasks). Not taking tylenol at this time.  Takes a baby aspirin daily.  PCM drew labs recently.  Recent cold weather has worsened her symptoms when temperature below freezing.  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     Review of Systems  Constitutional: Negative for activity change, appetite change, chills, diaphoresis, fatigue and fever.  HENT: Negative for trouble swallowing and voice change.   Eyes: Negative for photophobia and visual disturbance.  Respiratory: Negative for cough, shortness of breath, wheezing and stridor.   Cardiovascular: Negative for chest pain.  Gastrointestinal: Positive for abdominal distention, abdominal pain and nausea. Negative for anal bleeding, blood in stool, constipation, rectal pain and vomiting.  Endocrine: Negative for cold intolerance and heat intolerance.  Genitourinary: Negative for difficulty urinating.  Musculoskeletal: Positive for arthralgias, back pain, joint swelling and myalgias. Negative for gait problem,  neck pain and neck stiffness.  Skin: Negative for color change, pallor, rash and wound.  Allergic/Immunologic: Negative for food allergies.  Neurological: Negative for dizziness, tremors, seizures, syncope, facial asymmetry, speech difficulty, weakness, light-headedness, numbness and headaches.  Hematological: Negative for adenopathy. Does not bruise/bleed easily.  Psychiatric/Behavioral: Negative for agitation, confusion and sleep disturbance.       Objective:   Physical Exam Vitals and nursing note reviewed.  Constitutional:      General: She is awake. She is not in acute distress.    Appearance: Normal appearance. She is well-developed and well-groomed. She is not ill-appearing, toxic-appearing or diaphoretic.  HENT:     Head: Normocephalic and atraumatic.     Jaw: There is normal jaw occlusion.     Salivary Glands: Right salivary gland is not diffusely enlarged or tender. Left salivary gland is not diffusely enlarged or tender.     Right Ear: External ear normal.     Left Ear: External ear normal.     Nose: Nose normal. No congestion or rhinorrhea.     Mouth/Throat:     Pharynx: Oropharynx is clear.  Eyes:     General: Lids are normal. Vision grossly intact. Gaze aligned appropriately. Allergic shiner present. No visual field deficit or scleral icterus.       Right eye: No discharge.        Left eye: No discharge.     Extraocular Movements: Extraocular movements intact.     Conjunctiva/sclera: Conjunctivae normal.     Pupils: Pupils are equal, round, and reactive to light.  Neck:     Trachea: Trachea and phonation normal. No tracheal deviation.  Cardiovascular:  Rate and Rhythm: Normal rate and regular rhythm.     Pulses: Normal pulses.          Radial pulses are 2+ on the right side and 2+ on the left side.  Pulmonary:     Effort: Pulmonary effort is normal. No respiratory distress.     Breath sounds: Normal breath sounds and air entry. No stridor or transmitted upper  airway sounds. No wheezing or rhonchi.     Comments: Wearing mask due to covid 19 pandemic; spoke full sentences without difficulty; occasional mild nonproductive cough noted in clinic; no throat/nasal clearing Abdominal:     General: Bowel sounds are decreased. There is distension. There is no abdominal bruit. There are no signs of injury.     Palpations: Abdomen is soft. There is no shifting dullness, fluid wave, hepatomegaly, splenomegaly, mass or pulsatile mass.     Tenderness: There is abdominal tenderness in the left upper quadrant. There is no right CVA tenderness, left CVA tenderness, guarding or rebound. Negative signs include Murphy's sign.     Hernia: No hernia is present. There is no hernia in the umbilical area or ventral area.     Comments: Abdomen soft; LUQ TTP with medium to deep palpation; hypoactive bowel sounds x 4 quads; standing to sitting to supine and reverse on exam table without assist or difficulty  Musculoskeletal:        General: Tenderness present. No swelling, deformity or signs of injury.     Right shoulder: Normal.     Left shoulder: Normal.     Right elbow: No swelling, deformity, effusion or lacerations. Normal range of motion. No tenderness. No radial head, medial epicondyle, lateral epicondyle or olecranon process tenderness.     Left elbow: No swelling, deformity, effusion or lacerations. Normal range of motion. Tenderness present in lateral epicondyle. No radial head, medial epicondyle or olecranon process tenderness.     Right forearm: No swelling, edema, deformity, lacerations, tenderness or bony tenderness.     Left forearm: Tenderness present. No swelling, edema, deformity, lacerations or bony tenderness.     Right wrist: No swelling, deformity, effusion, lacerations, tenderness, bony tenderness, snuff box tenderness or crepitus. Normal range of motion. Normal pulse.     Left wrist: No swelling, deformity, effusion, lacerations, tenderness, bony tenderness,  snuff box tenderness or crepitus. Normal range of motion. Normal pulse.     Right hand: No swelling, lacerations, tenderness or bony tenderness. Decreased range of motion. Normal strength. Normal capillary refill. Normal pulse.     Left hand: No swelling, lacerations, tenderness or bony tenderness. Decreased range of motion. Normal strength. Normal capillary refill. Normal pulse.       Arms:     Cervical back: Normal range of motion and neck supple. No edema, erythema, signs of trauma, rigidity, torticollis, tenderness or crepitus. No pain with movement. Normal range of motion.     Comments: Bilateral hands DIP/PIP/MCP joints bones enlarged not hot to touch/no crepitus with AROM or edema pitting/nonpitting  Lymphadenopathy:     Head:     Right side of head: No submental, submandibular or preauricular adenopathy.     Left side of head: No submental, submandibular or preauricular adenopathy.     Cervical: No cervical adenopathy.     Right cervical: No superficial cervical adenopathy.    Left cervical: No superficial cervical adenopathy.  Skin:    General: Skin is warm and dry.     Capillary Refill: Capillary refill takes less than 2  seconds.     Coloration: Skin is not ashen, cyanotic, jaundiced, mottled, pale or sallow.     Findings: No abrasion, abscess, acne, bruising, burn, ecchymosis, erythema, signs of injury, laceration, lesion, petechiae, rash or wound.     Nails: There is no clubbing.  Neurological:     General: No focal deficit present.     Mental Status: She is alert and oriented to person, place, and time. Mental status is at baseline.     GCS: GCS eye subscore is 4. GCS verbal subscore is 5. GCS motor subscore is 6.     Cranial Nerves: Cranial nerves are intact. No cranial nerve deficit, dysarthria or facial asymmetry.     Sensory: Sensation is intact. No sensory deficit.     Motor: Motor function is intact. No weakness, tremor, atrophy, abnormal muscle tone or seizure activity.      Coordination: Coordination is intact. Coordination normal.     Gait: Gait is intact. Gait normal.     Comments: Gait sure and steady in clinic; on/off exam table and in/out of chair without difficulty; bilateral hand grasp 5/5  Psychiatric:        Attention and Perception: Attention and perception normal.        Mood and Affect: Mood and affect normal.        Speech: Speech normal.        Behavior: Behavior normal. Behavior is cooperative.        Thought Content: Thought content normal.        Cognition and Memory: Cognition and memory normal.        Judgment: Judgment normal.       Hands slightly swollen joints warm no erythema Left lateral epicondylitis TTP fitted and distributed strap from clinic stock Given hot/cold reusable pack from clinic stock Biofreeze, tylenol 325mg  given UD from clinic stock Abdomen dull to percussion x 4 quads; TTP LUQ mildly with mid/deep palpation only Hypoactive bowel sounds x 4 quads Reviewed Epic care everywhere colonoscopy/radiology and labs results x 5 years    Assessment & Plan:  A-muscle spasms, left tennis elbow acute, bilateral hand pain, diverticulitis acute, elevated blood pressure with diagnosis of hypertension, GERD, bilateral knee arthritis  P-cyclobenazeprine/flexeril 10mg  take 1/2-1 po TID prn muscle spasms #30 RF0 dispensed from [facility].  Tylenol 975-1000mg  po q6h prn pain given 8 UD from clinic stock.  Biofreeze gel apply QID prn given 4 UD from clinic stock  Avoid alcohol intake and driving while taking cyclobenazeprine/flexeril as drowsiness common side effect.  Slow position changes as medication also lower blood pressure.  Home stretches demonstrated to patient finger abduction/adduction, flexion/extension, wrist circles, wrist flexion, wrist extension.  Discussed epsom salt soak hot bowl water both hands 15 minutes TID prn Self massage or professional prn, foam roller use or tennis/racquetball.  Heat/cryotherapy 15 minutes QID prn.   Arthritis gloves measured and ordered for patient.  Reviewed epic and bilateral knees with osteoarthritis.  History of elevated TSH a few years ago and no repeat level.  On omeprazole and no magnesium levels on file in past year will draw nonfasting today also.  Consider physical therapy referral if no improvement with prescribed therapy from [facility] and/or chiropractic care.  Ensure ergonomics correct desk at work avoid repetitive motions if possible/holding phone/laptop in hand use desk/stand and/or break up lifting items into smaller loads/weights.  Patient was instructed to rest, ice, and ROM exercises.  Activity as tolerated.   Follow up if symptoms persist or worsen.  Exitcare handout on muscle spasms printed and given to patient.  Patient verbalized agreement and understanding of treatment plan and had no further questions at this time.  P2:  Injury Prevention and Fitness.  Patient given Exitcare handout on elbow tendonitis lateral with rehab exercises. Repetitive motion injury due to repetitive motions at work. Discussed rest, tylenol 975-1000mg  po q6h prn.  Lateral epicondylitis strap fitted and distributed from clinic stock to patient today by me.  Only has ice pack at home; gave hot/cold reusable ice pack from clinic stock today.  May use 15 minutes QID prn swelling/pain recommended but encouraged BID after work and evening every day. Follow up in 2 weeks re-evaluation if not improving consider PT/Orthpedics for steroid injection.  If no improvement may need referral to worker's comp clinic for work restrictions as I am not able to write work restrictions due to contract restrictions.  Discussed with patient takes longer to resolve when ongoing for months consider orthopedics and steroid injection if no relief with NSAID, rest, ice, strap. Exitcare handout on lateral epicondylitis and rehab exercises printed and given to patient.  Patient verbalized understanding of instructions, agreed with plan of care  and had no further questions at this time.   Reviewed up to date augmentin 875mg  po TID x 4-5 days recommended for diverticulitis and dispensed from [facility] to patient #20 RF0.  Exitcare handouts on diverticulitis and abdomen pain printed and given to patient.  Discussed avoid foods known to worsen her GERD.  Will check magnesium level since none on file in previous year and taking omeprazole chronically. And having muscle spasms.  Discussed diverticulitis symptoms/pathology with patient.  Established with GI and to follow up with GI if worsening symptoms/pain/distension/vomiting/watery stools, fever/chills, black or bright red blood, vomiting as no imaging at this clinic  Patient verbalized understanding information/instructions, agreed with plan of care and had no further questions at this time.  Hand symptoms consistent with arthritis but muscle spasms not typical of arthritis. Epic xrays show bilateral knee arthritis.  Discussed with arthritis tylenol 650mg  po daily (up to QID) has shown to be helpful along with heat/gentle AROM.  Exitcare handout on arthritis printed and given to patient. Will check TSH as previous slightly elevated a few years ago and PCM never checked again.  Encouraged stretching/gentle arom TID.  Consider epsom salt soaks when at home and heat/cryotherapy 15 minutes QID.  Patient verbalized understanding information/instructions, agreed with plan of care and had no further questions at this time.  Elevated blood pressure.  Consider weight loss, decreasing caffeine intake.  Acute pain today will check again after pain decreases/resolves.  PCM appts BP normal in the last 6 months.  Has been using topical NSAID also.  Taking her BP meds as prescribed daily.  Patient verbalized understanding information/instructions, agreed with plan of care and had no further questions at this time.  History GERD established with GI, chronic omeprazole use will check magnesium level due to hand cramps.   nonfasting magnesium level today with RN .  Patient verbalized understanding information/instructions, agreed with plan of care and had no further questions at this time.

## 2020-07-08 NOTE — Patient Instructions (Addendum)
Arthritis Arthritis is a term that is commonly used to refer to joint pain or joint disease. There are more than 100 types of arthritis. What are the causes? The most common cause of this condition is wear and tear of a joint. Other causes include:  Gout.  Inflammation of a joint.  An infection of a joint.  Sprains and other injuries near the joint.  A reaction to medicines or drugs, or an allergic reaction. In some cases, the cause may not be known. What are the signs or symptoms? The main symptom of this condition is pain in the joint during movement. Other symptoms include:  Redness, swelling, or stiffness at a joint.  Warmth coming from the joint.  Fever.  Overall feeling of illness. How is this diagnosed? This condition may be diagnosed with a physical exam and tests, including:  Blood tests.  Urine tests.  Imaging tests, such as X-rays, an MRI, or a CT scan. Sometimes, fluid is removed from a joint for testing. How is this treated? This condition may be treated with:  Treatment of the cause, if it is known.  Rest.  Raising (elevating) the joint.  Applying cold or hot packs to the joint.  Medicines to improve symptoms and reduce inflammation.  Injections of a steroid such as cortisone into the joint to help reduce pain and inflammation. Depending on the cause of your arthritis, you may need to make lifestyle changes to reduce stress on your joint. Changes may include:  Exercising more.  Losing weight. Follow these instructions at home: Medicines  Take over-the-counter and prescription medicines only as told by your health care provider.  Do not take aspirin to relieve pain if your health care provider thinks that gout may be causing your pain. Activity  Rest your joint if told by your health care provider. Rest is important when your disease is active and your joint feels painful, swollen, or stiff.  Avoid activities that make the pain worse. It is  important to balance activity with rest.  Exercise your joint regularly with range-of-motion exercises as told by your health care provider. Try doing low-impact exercise, such as: ? Swimming. ? Water aerobics. ? Biking. ? Walking. Managing pain, stiffness, and swelling  If directed, put ice on the joint. ? Put ice in a plastic bag. ? Place a towel between your skin and the bag. ? Leave the ice on for 20 minutes, 2-3 times per day.  If your joint is swollen, raise (elevate) it above the level of your heart if directed by your health care provider.  If your joint feels stiff in the morning, try taking a warm shower.  If directed, apply heat to the affected area as often as told by your health care provider. Use the heat source that your health care provider recommends, such as a moist heat pack or a heating pad. If you have diabetes, do not apply heat without permission from your health care provider. To apply heat: ? Place a towel between your skin and the heat source. ? Leave the heat on for 20-30 minutes. ? Remove the heat if your skin turns bright red. This is especially important if you are unable to feel pain, heat, or cold. You may have a greater risk of getting burned.      General instructions  Do not use any products that contain nicotine or tobacco, such as cigarettes, e-cigarettes, and chewing tobacco. If you need help quitting, ask your health care provider.    Keep all follow-up visits as told by your health care provider. This is important. Contact a health care provider if:  The pain gets worse.  You have a fever. Get help right away if:  You develop severe joint pain, swelling, or redness.  Many joints become painful and swollen.  You develop severe back pain.  You develop severe weakness in your leg.  You cannot control your bladder or bowels. Summary  Arthritis is a term that is commonly used to refer to joint pain or joint disease. There are more than  100 types of arthritis.  The most common cause of this condition is wear and tear of a joint. Other causes include gout, inflammation or infection of the joint, sprains, or allergies.  Symptoms of this condition include redness, swelling, or stiffness of the joint. Other symptoms include warmth, fever, or feeling ill.  This condition is treated with rest, elevation, medicines, and applying cold or hot packs.  Follow your health care provider's instructions about medicines, activity, exercises, and other home care treatments. This information is not intended to replace advice given to you by your health care provider. Make sure you discuss any questions you have with your health care provider. Document Revised: 05/08/2018 Document Reviewed: 05/08/2018 Elsevier Patient Education  2021 [employer] Muscle Cramps and Spasms Muscle cramps and spasms occur when a muscle or muscles tighten and you have no control over this tightening (involuntary muscle contraction). They are a common problem and can develop in any muscle. The most common place is in the calf muscles of the leg. Muscle cramps and muscle spasms are both involuntary muscle contractions, but there are some differences between the two:  Muscle cramps are painful. They come and go and may last for a few seconds or up to 15 minutes. Muscle cramps are often more forceful and last longer than muscle spasms.  Muscle spasms may or may not be painful. They may also last just a few seconds or much longer. Certain medical conditions, such as diabetes or Parkinson's disease, can make it more likely to develop cramps or spasms. However, cramps or spasms are usually not caused by a serious underlying problem. Common causes include:  Doing more physical work or exercise than your body is ready for (overexertion).  Overuse from repeating certain movements too many times.  Remaining in a certain position for a long period of time.  Improper  preparation, form, or technique while playing a sport or doing an activity.  Dehydration.  Injury.  Side effects of some medicines.  Abnormally low levels of the salts and minerals in your blood (electrolytes), especially potassium and calcium. This could happen if you are taking water pills (diuretics) or if you are pregnant. In many cases, the cause of muscle cramps or spasms is not known. Follow these instructions at home: Managing pain and stiffness  Try massaging, stretching, and relaxing the affected muscle. Do this for several minutes at a time.  If directed, apply heat to tight or tense muscles as often as told by your health care provider. Use the heat source that your health care provider recommends, such as a moist heat pack or a heating pad. ? Place a towel between your skin and the heat source. ? Leave the heat on for 20-30 minutes. ? Remove the heat if your skin turns bright red. This is especially important if you are unable to feel pain, heat, or cold. You may have a greater risk of getting burned.  If directed, put ice on the affected area. This may help if you are sore or have pain after a cramp or spasm. ? Put ice in a plastic bag. ? Place a towel between your skin and the bag. ? Leavethe ice on for 20 minutes, 2-3 times a day.  Try taking hot showers or baths to help relax tight muscles.      Eating and drinking  Drink enough fluid to keep your urine pale yellow. Staying well hydrated may help prevent cramps or spasms.  Eat a healthy diet that includes plenty of nutrients to help your muscles function. A healthy diet includes fruits and vegetables, lean protein, whole grains, and low-fat or nonfat dairy products. General instructions  If you are having frequent cramps, avoid intense exercise for several days.  Take over-the-counter and prescription medicines only as told by your health care provider.  Pay attention to any changes in your symptoms.  Keep  all follow-up visits as told by your health care provider. This is important. Contact a health care provider if:  Your cramps or spasms get more severe or happen more often.  Your cramps or spasms do not improve over time. Summary  Muscle cramps and spasms occur when a muscle or muscles tighten and you have no control over this tightening (involuntary muscle contraction).  The most common place for cramps or spasms to occur is in the calf muscles of the leg.  Massaging, stretching, and relaxing the affected muscle may relieve the cramp or spasm.  Drink enough fluid to keep your urine pale yellow. Staying well hydrated may help prevent cramps or spasms. This information is not intended to replace advice given to you by your health care provider. Make sure you discuss any questions you have with your health care provider. Document Revised: 10/24/2017 Document Reviewed: 10/24/2017 Else[employer] Patient Education  2021 [employer] Abdominal Pain, Adult Pain in the abdomen (abdominal pain) can be caused by many things. Often, abdominal pain is not serious and it gets better with no treatment or by being treated at home. However, sometimes abdominal pain is serious. Your health care provider will ask questions about your medical history and do a physical exam to try to determine the cause of your abdominal pain. Follow these instructions at home: Medicines  Take over-the-counter and prescription medicines only as told by your health care provider.  Do not take a laxative unless told by your health care provider. General instructions  Watch your condition for any changes.  Drink enough fluid to keep your urine pale yellow.  Keep all follow-up visits as told by your health care provider. This is important.   Contact a health care provider if:  Your abdominal pain changes or gets worse.  You are not hungry or you lose weight without trying.  You are constipated or have diarrhea for more  than 2-3 days.  You have pain when you urinate or have a bowel movement.  Your abdominal pain wakes you up at night.  Your pain gets worse with meals, after eating, or with certain foods.  You are vomiting and cannot keep anything down.  You have a fever.  You have blood in your urine. Get help right away if:  Your pain does not go away as soon as your health care provider told you to expect.  You cannot stop vomiting.  Your pain is only in areas of the abdomen, such as the right side or the left lower portion of the  abdomen. Pain on the right side could be caused by appendicitis.  You have bloody or black stools, or stools that look like tar.  You have severe pain, cramping, or bloating in your abdomen.  You have signs of dehydration, such as: ? Dark urine, very little urine, or no urine. ? Cracked lips. ? Dry mouth. ? Sunken eyes. ? Sleepiness. ? Weakness.  You have trouble breathing or chest pain. Summary  Often, abdominal pain is not serious and it gets better with no treatment or by being treated at home. However, sometimes abdominal pain is serious.  Watch your condition for any changes.  Take over-the-counter and prescription medicines only as told by your health care provider.  Contact a health care provider if your abdominal pain changes or gets worse.  Get help right away if you have severe pain, cramping, or bloating in your abdomen. This information is not intended to replace advice given to you by your health care provider. Make sure you discuss any questions you have with your health care provider. Document Revised: 07/20/2019 Document Reviewed: 10/09/2018 Else[employer] Patient Education  2021 [employer] Diverticulitis  Diverticulitis is infection or inflammation of small pouches (diverticula) in the colon that form due to a condition called diverticulosis. Diverticula can trap stool (feces) and bacteria, causing infection and  inflammation. Diverticulitis may cause severe stomach pain and diarrhea. It may lead to tissue damage in the colon that causes bleeding or blockage. The diverticula may also burst (rupture) and cause infected stool to enter other areas of the abdomen. What are the causes? This condition is caused by stool becoming trapped in the diverticula, which allows bacteria to grow in the diverticula. This leads to inflammation and infection. What increases the risk? You are more likely to develop this condition if you have diverticulosis. The risk increases if you:  Are overweight or obese.  Do not get enough exercise.  Drink alcohol.  Use tobacco products.  Eat a diet that has a lot of red meat such as beef, pork, or lamb.  Eat a diet that does not include enough fiber. High-fiber foods include fruits, vegetables, beans, nuts, and whole grains.  Are over 60 years of age. What are the signs or symptoms? Symptoms of this condition may include:  Pain and tenderness in the abdomen. The pain is normally located on the left side of the abdomen, but it may occur in other areas.  Fever and chills.  Nausea.  Vomiting.  Cramping.  Bloating.  Changes in bowel routines.  Blood in your stool. How is this diagnosed? This condition is diagnosed based on:  Your medical history.  A physical exam.  Tests to make sure there is nothing else causing your condition. These tests may include: ? Blood tests. ? Urine tests. ? CT scan of the abdomen. How is this treated? Most cases of this condition are mild and can be treated at home. Treatment may include:  Taking over-the-counter pain medicines.  Following a clear liquid diet.  Taking antibiotic medicines by mouth.  Resting. More severe cases may need to be treated at a hospital. Treatment may include:  Not eating or drinking.  Taking prescription pain medicine.  Receiving antibiotic medicines through an IV.  Receiving fluids and  nutrition through an IV.  Surgery. When your condition is under control, your health care provider may recommend that you have a colonoscopy. This is an exam to look at the entire large intestine. During the exam, a lubricated, bendable tube  is inserted into the anus and then passed into the rectum, colon, and other parts of the large intestine. A colonoscopy can show how severe your diverticula are and whether something else may be causing your symptoms. Follow these instructions at home: Medicines  Take over-the-counter and prescription medicines only as told by your health care provider. These include fiber supplements, probiotics, and stool softeners.  If you were prescribed an antibiotic medicine, take it as told by your health care provider. Do not stop taking the antibiotic even if you start to feel better.  Ask your health care provider if the medicine prescribed to you requires you to avoid driving or using machinery. Eating and drinking  Follow a full liquid diet or another diet as directed by your health care provider.  After your symptoms improve, your health care provider may tell you to change your diet. He or she may recommend that you eat a diet that contains at least 25 grams (25 g) of fiber daily. Fiber makes it easier to pass stool. Healthy sources of fiber include: ? Berries. One cup contains 4-8 grams of fiber. ? Beans or lentils. One-half cup contains 5-8 grams of fiber. ? Green vegetables. One cup contains 4 grams of fiber.  Avoid eating red meat.   General instructions  Do not use any products that contain nicotine or tobacco, such as cigarettes, e-cigarettes, and chewing tobacco. If you need help quitting, ask your health care provider.  Exercise for at least 30 minutes, 3 times each week. You should exercise hard enough to raise your heart rate and break a sweat.  Keep all follow-up visits as told by your health care provider. This is important. You may need to  have a colonoscopy. Contact a health care provider if:  Your pain does not improve.  Your bowel movements do not return to normal. Get help right away if:  Your pain gets worse.  Your symptoms do not get better with treatment.  Your symptoms suddenly get worse.  You have a fever.  You vomit more than one time.  You have stools that are bloody, black, or tarry. Summary  Diverticulitis is infection or inflammation of small pouches (diverticula) in the colon that form due to a condition called diverticulosis. Diverticula can trap stool (feces) and bacteria, causing infection and inflammation.  You are at higher risk for this condition if you have diverticulosis and you eat a diet that does not include enough fiber.  Most cases of this condition are mild and can be treated at home. More severe cases may need to be treated at a hospital.  When your condition is under control, your health care provider may recommend that you have an exam called a colonoscopy. This exam can show how severe your diverticula are and whether something else may be causing your symptoms.  Keep all follow-up visits as told by your health care provider. This is important. This information is not intended to replace advice given to you by your health care provider. Make sure you discuss any questions you have with your health care provider. Document Revised: 03/12/2019 Document Reviewed: 03/12/2019 Else[employer] Patient Education  2021 [employer] Tennis Elbow Rehab Ask your health care provider which exercises are safe for you. Do exercises exactly as told by your health care provider and adjust them as directed. It is normal to feel mild stretching, pulling, tightness, or discomfort as you do these exercises. Stop right away if you feel sudden pain or your pain  gets worse. Do not begin these exercises until told by your health care provider. Stretching and range-of-motion exercises These exercises warm up your  muscles and joints and improve the movement and flexibility of your elbow. Wrist flexion, assisted 1. Straighten your left / right elbow in front of you with your palm facing down toward the floor. ? If told by your health care provider, bend your left / right elbow to a 90-degree angle (right angle) at your side instead of holding it straight. 2. With your other hand, gently push over the back of your left / right hand so your fingers point toward the floor (flexion). Stop when you feel a gentle stretch on the back of your forearm. 3. Hold this position for _____5-15_____ seconds. Repeat _____3_____ times. Complete this exercise _____3_____ times a day.   Wrist extension, assisted 1. Straighten your left / right elbow in front of you with your palm facing up toward the ceiling. ? If told by your health care provider, bend your left / right elbow to a 90-degree angle (right angle) at your side instead of holding it straight. 2. With your other hand, gently pull your left / right hand and fingers toward the floor (extension). Stop when you feel a gentle stretch on the palm side of your forearm. 3. Hold this position for __5-15________ seconds. Repeat ____3_____ times. Complete this exercise _____3_____ times a day.   Assisted forearm rotation, supination 1. Sit or stand with your elbows at your side. 2. Bend your left / right elbow to a 90-degree angle (right angle). 3. Using your uninjured hand, turn your left / right palm up toward the ceiling (supination) until you feel a gentle stretch along the inside of your forearm. 4. Hold this position for ___5-15_______ seconds. Repeat ____3______ times. Complete this exercise _____3_____ times a day. Assisted forearm rotation, pronation 1. Sit or stand with your elbows at your side. 2. Bend your left / right elbow to a 90-degree angle (right angle). 3. Using your uninjured hand, turn your left / right palm down toward the floor (pronation) until you  feel a gentle stretch along the outside of your forearm. 4. Hold this position for ___5-15_______ seconds. Repeat ____3______ times. Complete this exercise _____3_____ times a day. Strengthening exercises These exercises build strength and endurance in your forearm and elbow. Endurance is the ability to use your muscles for a long time, even after they get tired. Radial deviation 1. Stand with a ____none______ weight or a hammer in your left / right hand. Or, sit while holding a rubber exercise band or tubing, with your left / right forearm supported on a table or countertop. ? Position your forearm so that the thumb is facing the ceiling, as if you are going to clap your hands. This is the neutral position. 2. Raise your hand upward in front of you so your thumb moves toward the ceiling (radial deviation), or pull up on the rubber tubing. Keep your forearm and elbow still while you move your wrist only. 3. Hold this position for ___5-15_______ seconds. 4. Slowly return to the starting position. Repeat ____3______ times. Complete this exercise _____3_____ times a day.   Wrist extension, eccentric 1. Sit with your left / right forearm palm-down and supported on a table or other surface. Let your left / right wrist extend over the edge of the surface. 2. Hold a ____none______ weight or a piece of exercise band or tubing in your left / right hand. ? If  using a rubber exercise band or tubing, hold the other end of the tubing with your other hand. 3. Use your uninjured hand to move your left / right hand up toward the ceiling. 4. Take your uninjured hand away and slowly return to the starting position using only your left / right hand. Lowering your arm under tension is called eccentric extension. Repeat ___3_______ times. Complete this exercise ______3____ times a day. Wrist extension Do not do this exercise if it causes pain at the outside of your elbow. Only do this exercise once instructed by your  health care provider. 1. Sit with your left / right forearm supported on a table or other surface and your palm turned down toward the floor. Let your left / right wrist extend over the edge of the surface. 2. Hold a __none______ weight or a piece of rubber exercise band or tubing. ? If you are using a rubber exercise band or tubing, hold the band or tubing in place with your other hand to provide resistance. 3. Slowly bend your wrist so your hand moves up toward the ceiling (extension). Move only your wrist, keeping your forearm and elbow still. 4. Hold this position for ____5-15______ seconds. 5. Slowly return to the starting position. Repeat _____3_____ times. Complete this exercise _____3_____ times a day.   Forearm rotation, supination To do this exercise, you will need a lightweight hammer or rubber mallet. 1. Sit with your left / right forearm supported on a table or other surface. Bend your elbow to a 90-degree angle (right angle). Position your forearm so that your palm is facing down toward the floor, with your hand resting over the edge of the table. 2. Hold a hammer in your left / right hand. ? To make this exercise easier, hold the hammer near the head of the hammer. ? To make this exercise harder, hold the hammer near the end of the handle. 3. Without moving your wrist or elbow, slowly rotate your forearm so your palm faces up toward the ceiling (supination). 4. Hold this position for ___5-15_____ seconds. 5. Slowly return to the starting position. Repeat _____3_____ times. Complete this exercise _____3_____ times a day.   Shoulder blade squeeze 1. Sit in a stable chair or stand with good posture. If you are sitting down, do not let your back touch the back of the chair. 2. Your arms should be at your sides with your elbows bent to a 90-degree angle (right angle). Position your forearms so that your thumbs are facing the ceiling (neutral position). 3. Without lifting your shoulders  up, squeeze your shoulder blades tightly together. 4. Hold this position for ___5-15_______ seconds. 5. Slowly release and return to the starting position. Repeat _____3____ times. Complete this exercise ___3_____ times a day. This information is not intended to replace advice given to you by your health care provider. Make sure you discuss any questions you have with your health care provider. Document Revised: 08/22/2019 Document Reviewed: 08/22/2019 Else[employer] Patient Education  2021 [employer] Tennis Elbow  Tennis elbow (lateral epicondylitis) is inflammation of tendons in your outer forearm, near your elbow. Tendons are tissues that connect muscle to bone. When you have tennis elbow, inflammation affects the tendons that you use to bend your wrist and move your hand up. Inflammation occurs in the lower part of the upper arm bone (humerus), where the tendons connect to the bone (lateral epicondyle). Tennis elbow often affects people who play tennis, but anyone may get the condition  from repeatedly extending the wrist or turning the forearm. What are the causes? This condition is usually caused by repeatedly extending the wrist, turning the forearm, and using the hands. It can result from sports or work that requires repetitive forearm movements. In some cases, it may be caused by a sudden injury. What increases the risk? You are more likely to develop tennis elbow if you play tennis or another racket sport. You also have a higher risk if you frequently use your hands for work. Besides people who play tennis, others at greater risk include:  People who use computers.  Holiday representative workers.  People who work in [employer].  Musicians.  Cooks.  Cashiers. What are the signs or symptoms? Symptoms of this condition include:  Pain and tenderness in the forearm and the outer part of the elbow. Pain may be felt only when using the arm, or it may be there all the time.  A burning feeling  that starts in the elbow and spreads down the forearm.  A weak grip in the hand. How is this diagnosed? This condition is diagnosed based on your symptoms, your medical history, and a physical exam. You may also have X-rays or an MRI to:  Confirm the diagnosis.  Look for other issues.  Check for tears in the ligaments, muscles, or tendons. How is this treated? Resting and icing your arm is often the first treatment. Your health care provider may also recommend:  Medicines to reduce pain and inflammation. These may be in the form of a pill, topical gels, or shots of a steroid medicine (cortisone).  An elbow strap to reduce stress on the area.  Physical therapy. This may include massage or exercises or both.  An elbow brace to restrict the movements that cause symptoms. If these treatments do not help relieve your symptoms, your health care provider may recommend surgery to remove damaged muscle and reattach healthy muscle to bone. Follow these instructions at home: If you have a brace or strap:  Wear the brace or strap as told by your health care provider. Remove it only as told by your health care provider.  Check the skin around the brace or strap every day. Tell your health care provider about any concerns.  Loosen the brace if your fingers tingle, become numb, or turn cold and blue.  Keep the brace clean.  If the brace or strap is not waterproof: ? Do not let it get wet. ? Cover it with a watertight covering when you take a bath or a shower. Managing pain, stiffness, and swelling  If directed, put ice on the injured area. To do this: ? If you have a removable brace or strap, remove it as told by your health care provider. ? Put ice in a plastic bag. ? Place a towel between your skin and the bag. ? Leave the ice on for 20 minutes, 2-3 times a day. ? Remove the ice if your skin turns bright red. This is very important. If you cannot feel pain, heat, or cold, you have a  greater risk of damage to the area.  Move your fingers often to reduce stiffness and swelling.   Activity  Rest your elbow and wrist and avoid activities that cause symptoms as told by your health care provider.  Do physical therapy exercises as told by your health care provider.  If you lift an object, lift it with your palm facing up. This reduces stress on your elbow. Lifestyle  If  your tennis elbow is caused by sports, check your equipment and make sure that: ? You use it correctly. ? It is good match for you.  If your tennis elbow is caused by work or computer use, take frequent breaks to stretch your arm. Talk with your employer about ways to manage your condition at work. General instructions  Take over-the-counter and prescription medicines only as told by your health care provider.  Do not use any products that contain nicotine or tobacco. These products include cigarettes, chewing tobacco, and vaping devices, such as e-cigarettes. If you need help quitting, ask your health care provider.  Keep all follow-up visits. This is important. How is this prevented?  Before and after activity: ? Warm up and stretch before being active. ? Cool down and stretch after being active. ? Give your body time to rest between periods of activity.  During activity: ? Make sure to use equipment that fits you. ? If you play tennis, put power in your stroke with your lower body. Avoid using your arm only.  Maintain physical fitness, including: ? Strength. ? Flexibility. ? Endurance.  Do exercises to strengthen the forearm muscles. Contact a health care provider if:  You have pain that gets worse or does not get better with treatment.  You have numbness or weakness in your forearm, hand, or fingers. Get help right away if:  Your pain is severe.  You cannot move your wrist. Summary  Tennis elbow (lateral epicondylitis) is inflammation of tendons in your outer forearm, near your  elbow.  Common symptoms include pain and tenderness in your forearm and the outer part of your elbow.  This condition is usually caused by repeatedly extending your wrist, turning your forearm, and using your hands.  The first treatment is often resting and icing your arm to relieve symptoms. Further treatment may include taking medicine, getting physical therapy, wearing a brace or strap, or having surgery. This information is not intended to replace advice given to you by your health care provider. Make sure you discuss any questions you have with your health care provider. Document Revised: 12/11/2019 Document Reviewed: 12/11/2019 Elsevier Patient Education  2021 [employer].

## 2020-07-09 LAB — THYROID PANEL WITH TSH
Free Thyroxine Index: 2.2 (ref 1.2–4.9)
T3 Uptake Ratio: 27 % (ref 24–39)
T4, Total: 8 ug/dL (ref 4.5–12.0)
TSH: 4.82 u[IU]/mL — ABNORMAL HIGH (ref 0.450–4.500)

## 2020-07-09 LAB — MAGNESIUM: Magnesium: 2.1 mg/dL (ref 1.6–2.3)

## 2020-08-18 ENCOUNTER — Telehealth: Payer: Self-pay | Admitting: *Deleted

## 2020-08-18 NOTE — Telephone Encounter (Signed)
Contacted pt and no answer. Left message that it is time for 6 months lung screening scan. I interested to please call shawn [phone]. Also will ask few questions to make sure you qualify and would like to get scan around 3/20 or 1-2 weeks from that date.

## 2020-08-22 ENCOUNTER — Telehealth: Payer: Self-pay | Admitting: Licensed Clinical Social Worker

## 2020-08-22 ENCOUNTER — Other Ambulatory Visit: Payer: Self-pay | Admitting: *Deleted

## 2020-08-22 DIAGNOSIS — Z87891 Personal history of nicotine dependence: Secondary | ICD-10-CM

## 2020-08-22 DIAGNOSIS — R918 Other nonspecific abnormal finding of lung field: Secondary | ICD-10-CM

## 2020-08-22 DIAGNOSIS — Z122 Encounter for screening for malignant neoplasm of respiratory organs: Secondary | ICD-10-CM

## 2020-08-22 NOTE — Telephone Encounter (Signed)
From: Joella Prince  To: Dr. Estill Batten  Sent: 08/22/2020 2:02 PM EST  Subject: refill     hi dr Yvette Rack    my pharmacy sent over a request for fluconazole 150mg  can you refill a couple leaving on vacation monday then i need to schedule appointment for my annual check      thank you

## 2020-08-22 NOTE — Telephone Encounter (Signed)
Patient requesting refill of meds prior to 4/18 appt with Dr. Yvette Rack    fluconazole (DIFLUCAN) 150 MG     She usually gets a refill for a year due to her sugars not right, she would like this sent to:    [id number] [place], [place] - [id number] [address] [phone] [address] [phone]    Patient can be reached at [phone]

## 2020-08-22 NOTE — Telephone Encounter (Signed)
done

## 2020-08-22 NOTE — Progress Notes (Signed)
Contacted and scheduled for LCS nodule follow up. Patient is a former smoker, quit 2016, 54 pack year history.

## 2020-08-22 NOTE — Telephone Encounter (Signed)
Spoke to patient and she accepted an appt of lung screening for 09/03/2020 at 10:30am. Patient does not smoke, she quit 8 years ago and has the same insurance.

## 2020-08-23 MED ORDER — FLUCONAZOLE 150 MG PO TABS
150 MG | ORAL_TABLET | ORAL | 3 refills | Status: AC
Start: 2020-08-23 — End: 2021-05-20

## 2020-09-03 ENCOUNTER — Other Ambulatory Visit: Payer: Self-pay

## 2020-09-03 ENCOUNTER — Ambulatory Visit
Admission: RE | Admit: 2020-09-03 | Discharge: 2020-09-03 | Disposition: A | Discharge status: Home / Self Care | Payer: PRIVATE HEALTH INSURANCE | Source: Ambulatory Visit | Attending: Oncology | Admitting: Oncology

## 2020-09-03 DIAGNOSIS — Z87891 Personal history of nicotine dependence: Secondary | ICD-10-CM | POA: Insufficient documentation

## 2020-09-03 DIAGNOSIS — Z122 Encounter for screening for malignant neoplasm of respiratory organs: Secondary | ICD-10-CM | POA: Insufficient documentation

## 2020-09-03 DIAGNOSIS — J439 Emphysema, unspecified: Secondary | ICD-10-CM | POA: Insufficient documentation

## 2020-09-03 DIAGNOSIS — I700 Atherosclerosis of aorta: Secondary | ICD-10-CM | POA: Insufficient documentation

## 2020-09-03 DIAGNOSIS — R918 Other nonspecific abnormal finding of lung field: Secondary | ICD-10-CM | POA: Insufficient documentation

## 2020-09-03 DIAGNOSIS — K5790 Diverticulosis of intestine, part unspecified, without perforation or abscess without bleeding: Secondary | ICD-10-CM | POA: Insufficient documentation

## 2020-09-03 DIAGNOSIS — K8020 Calculus of gallbladder without cholecystitis without obstruction: Secondary | ICD-10-CM | POA: Insufficient documentation

## 2020-09-09 ENCOUNTER — Encounter: Payer: Self-pay | Admitting: *Deleted

## 2020-09-15 ENCOUNTER — Encounter: Attending: Obstetrics & Gynecology | Primary: Internal Medicine

## 2020-09-16 ENCOUNTER — Telehealth: Payer: Self-pay | Admitting: *Deleted

## 2020-09-16 ENCOUNTER — Encounter: Payer: Self-pay | Admitting: *Deleted

## 2020-09-16 NOTE — Telephone Encounter (Signed)
RN notified by HR that pt called out of work last two days, citing non Covid reasons. RN spoke with pt by phone. She reports BP very elevated this weekend and was seen at [facility]. Then developed a tooth infection as well and is on Amoxicillin and Tramadol. She has not had much appetite due to tooth infection and meds. She is feeling better today and plans to RTW tomorrow 4/6. Cleared to RTW 4/6, personal medical. Advised pt if she feels she cannot, then to follow normal call out procedures. She verbalizes understanding and agreement. Denies further questions/concerns.

## 2020-09-16 NOTE — Telephone Encounter (Signed)
Reviewed RN Haley note agreed with plan of care. 

## 2020-09-22 NOTE — Telephone Encounter (Signed)
Requested Prescriptions     Pending Prescriptions Disp Refills   ??? fluconazole (DIFLUCAN) 150 MG tablet [Pharmacy Med Name: FLUCONAZOLE 150 MG TABLET] 3 tablet 3     Sig: TAKE 1 TABLET BY MOUTH EVERY 28 DAYS     Last ov: 08/27/19  Last labs: 01/14/20  Next ov: 09/29/20

## 2020-09-29 ENCOUNTER — Encounter: Payer: PRIVATE HEALTH INSURANCE | Attending: Obstetrics & Gynecology | Primary: Internal Medicine

## 2020-10-20 ENCOUNTER — Ambulatory Visit
Admit: 2020-10-20 | Discharge: 2020-10-20 | Payer: PRIVATE HEALTH INSURANCE | Attending: Obstetrics & Gynecology | Primary: Internal Medicine

## 2020-10-20 DIAGNOSIS — Z01419 Encounter for gynecological examination (general) (routine) without abnormal findings: Secondary | ICD-10-CM

## 2020-10-20 NOTE — Progress Notes (Signed)
Subjective:      Patient ID: Linda Jacobs is a 60 y.o. female.    Patient is here for annual. Patient with some pelvic cramping. Patient with strong family hx female cancers.     Gynecologic Exam        Review of Systems   Constitutional: Negative.    HENT: Negative.    Eyes: Negative.    Respiratory: Negative.    Cardiovascular: Negative.    Gastrointestinal: Negative.    Genitourinary: Positive for pelvic pain.   Musculoskeletal: Negative.    Skin: Negative.    Neurological: Negative.    Psychiatric/Behavioral: Negative.      Date of Birth [birth year]  Past Medical History:   Diagnosis Date   ??? Diabetes mellitus (HCC)    ??? High cholesterol    ??? HPV (human papilloma virus) infection      Past Surgical History:   Procedure Laterality Date   ??? CESAREAN SECTION     ??? LAPAROSCOPY      infertility     OB History   Gravida Para Term Preterm AB Living   1 1 1          SAB IAB Ectopic Molar Multiple Live Births             1      # Outcome Date GA Lbr Len/2nd Weight Sex Delivery Anes PTL Lv   1 Term 11/02/01    F [id number]        Social History     Socioeconomic History   ??? Marital status: Married     Spouse name: Not on file   ??? Number of children: Not on file   ??? Years of education: Not on file   ??? Highest education level: Not on file   Occupational History   ??? Not on file   Tobacco Use   ??? Smoking status: Never Smoker   ??? Smokeless tobacco: Never Used   Substance and Sexual Activity   ??? Alcohol use: Yes     Comment: occ.   ??? Drug use: No   ??? Sexual activity: Never   Other Topics Concern   ??? Not on file   Social History Narrative   ??? Not on file     Social Determinants of Health     Financial Resource Strain:    ??? Difficulty of Paying Living Expenses: Not on file   Food Insecurity:    ??? Worried About Running Out of Food in the Last Year: Not on file   ??? Ran Out of Food in the Last Year: Not on file   Transportation Needs:    ??? Lack of Transportation (Medical): Not on file   ??? Lack of Transportation (Non-Medical): Not on file    Physical Activity:    ??? Days of Exercise per Week: Not on file   ??? Minutes of Exercise per Session: Not on file   Stress:    ??? Feeling of Stress : Not on file   Social Connections:    ??? Frequency of Communication with Friends and Family: Not on file   ??? Frequency of Social Gatherings with Friends and Family: Not on file   ??? Attends Religious Services: Not on file   ??? Active Member of Clubs or Organizations: Not on file   ??? Attends 11/04/01 Meetings: Not on file   ??? Marital Status: Not on file   Intimate Partner Violence:    ??? Fear of Current or  Ex-Partner: Not on file   ??? Emotionally Abused: Not on file   ??? Physically Abused: Not on file   ??? Sexually Abused: Not on file   Housing Stability:    ??? Unable to Pay for Housing in the Last Year: Not on file   ??? Number of Places Lived in the Last Year: Not on file   ??? Unstable Housing in the Last Year: Not on file     Allergies   Allergen Reactions   ??? Latex Rash     blisters   ??? Pcn [Penicillins] Hives     Outpatient Medications Marked as Taking for the 10/20/20 encounter (Office Visit) with Estill Batten, MD   Medication Sig Dispense Refill   ??? fluconazole (DIFLUCAN) 150 MG tablet Take 1 tablet by mouth every 28 days 3 tablet 3   ??? aspirin 81 MG tablet Take 81 mg by mouth daily     ??? empagliflozin (JARDIANCE) 25 MG tablet Take 25 mg by mouth daily     ??? metFORMIN (GLUCOPHAGE) 500 MG tablet Take 500 g by mouth daily     ??? VICTOZA 18 MG/3ML SOPN SC injection Take 18 mg by mouth daily     ??? lisinopril (PRINIVIL;ZESTRIL) 5 MG tablet Take 5 mg by mouth daily     ??? FARXIGA 10 MG tablet Take 10 mg by mouth daily     ??? fluconazole (DIFLUCAN) 150 MG tablet Take 150 mg by mouth daily     ??? atorvastatin (LIPITOR) 20 MG tablet Take 20 mg by mouth daily       Family History   Problem Relation Age of Onset   ??? Breast Cancer Mother    ??? Lung Cancer Father    ??? Kidney Cancer Father    ??? Ovarian Cancer Maternal Grandmother    ??? Stomach Cancer Maternal Grandfather    ???  Thyroid Cancer Paternal Grandmother    ??? Lung Cancer Paternal Grandfather      BP 122/76 (Site: Right Lower Arm, Position: Sitting, Cuff Size: Medium Adult)    Pulse 80    Resp 17    Ht 5\' 9"  (1.753 m)    Wt 272 lb (123.4 kg)    LMP  (LMP Unknown)    SpO2 98%    BMI 40.17 kg/m??       Objective:   Physical Exam  Constitutional:       General: She is not in acute distress.     Appearance: Normal appearance. She is well-developed and normal weight. She is not diaphoretic.   HENT:      Head: Normocephalic and atraumatic.      Nose: Nose normal.      Mouth/Throat:      Mouth: Mucous membranes are moist.      Pharynx: Oropharynx is clear.   Eyes:      Pupils: Pupils are equal, round, and reactive to light.   Neck:      Thyroid: No thyromegaly.   Cardiovascular:      Rate and Rhythm: Normal rate and regular rhythm.      Heart sounds: Normal heart sounds. No murmur heard.  No friction rub. No gallop.    Pulmonary:      Effort: Pulmonary effort is normal. No respiratory distress.      Breath sounds: Normal breath sounds. No wheezing or rales.   Chest:   Breasts:      Right: Normal. No swelling, bleeding, inverted nipple, mass, nipple  discharge, skin change or tenderness.      Left: Normal. No swelling, bleeding, inverted nipple, mass, nipple discharge, skin change or tenderness.       Abdominal:      General: Abdomen is flat. Bowel sounds are normal. There is no distension.      Palpations: Abdomen is soft. There is no hepatomegaly or mass.      Tenderness: There is no abdominal tenderness. There is no guarding or rebound.      Hernia: No hernia is present. There is no hernia in the left inguinal area.   Genitourinary:     General: Normal vulva.      Exam position: Lithotomy position.      Pubic Area: No rash.       Labia:         Right: No rash, tenderness, lesion or injury.         Left: No rash, tenderness, lesion or injury.       Urethra: No prolapse, urethral pain, urethral swelling or urethral lesion.      Vagina:  Normal. No signs of injury and foreign body. No vaginal discharge, erythema, tenderness or bleeding.      Cervix: No cervical motion tenderness, discharge, friability, lesion, erythema, cervical bleeding or eversion.      Uterus: Not deviated, not enlarged, not fixed, not tender and no uterine prolapse.       Adnexa:         Right: No mass, tenderness or fullness.          Left: No mass, tenderness or fullness.        Rectum: Normal. Guaiac result negative. No mass, tenderness, anal fissure, external hemorrhoid or internal hemorrhoid. Normal anal tone.      Comments: Normal urethral meatus, nl urethra, nl bladder.    Musculoskeletal:         General: No tenderness. Normal range of motion.      Cervical back: Normal range of motion and neck supple. No rigidity.   Lymphadenopathy:      Cervical: No cervical adenopathy.      Lower Body: No right inguinal adenopathy. No left inguinal adenopathy.   Skin:     General: Skin is warm and dry.      Findings: No erythema or rash.   Neurological:      General: No focal deficit present.      Mental Status: She is alert and oriented to person, place, and time.      Deep Tendon Reflexes: Reflexes are normal and symmetric.   Psychiatric:         Mood and Affect: Mood normal.         Behavior: Behavior normal.         Thought Content: Thought content normal.         Judgment: Judgment normal.         Assessment:      1. Annual  2. Menopause  3. Pelvic cramping  4. Strong family hx cancer      Plan:      1. Pap, calcium, exercise, mammogram, hemocult negative  2. Stable  3. 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 Referral to genetic counselor        Estill Batten, MD

## 2020-10-24 ENCOUNTER — Encounter: Payer: Self-pay | Admitting: *Deleted

## 2020-10-24 ENCOUNTER — Telehealth: Payer: Self-pay | Admitting: *Deleted

## 2020-10-24 DIAGNOSIS — Z20822 Contact with and (suspected) exposure to covid-19: Secondary | ICD-10-CM

## 2020-10-24 NOTE — Telephone Encounter (Signed)
RN notified by HR that pt reported that her daughter in the same household has tested positive for Covid. Spoke with pt by phone. She reports last exposure to daughter was Sunday 5/8. Daughter tested positive 5/12. Daughter is isolated in home. Pt is asymptomatic. She does have a home test at home and will take that this evening being Day 5 since exposure. Will monitor for sx and have strict mask use thru Day 10 5/18.  Will f/u this weekend for test results and sx check. Pt is boosted so no quarantine indicated at this time. Advised masks in home when daughter in common areas, clean/disinfect high touch surfaces. Pt agreeable with plan. Denies further questions.

## 2020-10-24 NOTE — Telephone Encounter (Signed)
Reviewed RN Haley notes agreed with plan of care. 

## 2020-10-26 NOTE — Telephone Encounter (Signed)
Pt reports home test on 5/13 was negative. She remains asymptomatic. Advised to continue to self monitor and have strict mask use thru 5/18. Pt agreeable. Denies questions or concerns.

## 2020-10-28 NOTE — Telephone Encounter (Signed)
Reviewed RN Rolly Salter note negative home test continue strict mask use through 10/29/20 and monitoring for symptoms agreed with plan of care.

## 2020-10-30 NOTE — Telephone Encounter (Signed)
Reviewed RN Rolly Salter note agreed with plan of care last day of strict mask use yesterday and patient continues asymptomatic.

## 2020-10-30 NOTE — Telephone Encounter (Signed)
Pt reports by phone still asymptomatic. Strict mask use and monitoring ended yesterday. Closing encounter.

## 2021-03-19 ENCOUNTER — Telehealth: Payer: Self-pay | Admitting: Registered Nurse

## 2021-03-19 ENCOUNTER — Encounter: Payer: Self-pay | Admitting: Registered Nurse

## 2021-03-19 NOTE — Telephone Encounter (Signed)
Patient requested refill atorvastatin, hydrochlorothiazide and omeprazole from [facility] formulary.  Rx expired on paper chart.  Bridge refill today as patient has appt pending with PCM 04/08/21.  Last fill 12/02/20  labs 12/02/20  Glucose 70 - 110 mg/dL 124 High    Sodium 136 - 145 mmol/L 140   Potassium 3.6 - 5.1 mmol/L 3.8   Chloride 97 - 109 mmol/L 101   Carbon Dioxide (CO2) 22.0 - 32.0 mmol/L 31.5   Urea Nitrogen (BUN) 7 - 25 mg/dL 23   Creatinine 0.6 - 1.1 mg/dL 1.2 High    Glomerular Filtration Rate (eGFR), MDRD Estimate >60 mL/min/1.73sq m 46 Low    Calcium 8.7 - 10.3 mg/dL 9.4   AST  8 - 39 U/L 22   ALT  5 - 38 U/L 34   Alk Phos (alkaline Phosphatase) 34 - 104 U/L 95   Albumin 3.5 - 4.8 g/dL 4.1   Bilirubin, Total 0.3 - 1.2 mg/dL 0.3   Protein, Total 6.1 - 7.9 g/dL 7.5   A/G Ratio 1.0 - 5.0 gm/dL 1.2   Resulting Agency  [facility]  Specimen Collected: 12/02/20 08:00 Last Resulted: 12/02/20 09:55  Received From: [facility]  Result Received: 03/19/21 11:27   Recent Data from [facility] Related to Comprehensive Metabolic Panel (CMP) Component 12/02/20 05/27/20 11/23/19 05/25/19 01/25/19 09/25/18  Glucose 124 High  126 High  123 High  123 High  126 High  118 High   Sodium 140 137 138 136 139 137  Potassium 3.8 3.8 4.0 3.9 3.6 3.8  Chloride 101 99 100 100 100 100  Carbon Dioxide (CO2) 31.5 29.8 30.5 28.5 31.3 29.4  Urea Nitrogen (BUN) $RemoveBefore'23 19 25 29 'ZzCTWycBRdFwK$ High  18 23  Creatinine 1.2 High  0.9 1.0 0.9 0.8 0.8  Glomerular Filtration Rate (eGFR), MDRD Estimate 46 Low  64 57 Low  64 74 74  Calcium 9.4 9.6 9.4 9.2 9.4 9.1  AST  $Re'22 21 21 23 23 19  'GOi$ ALT  34 28 28 33 31 27  Alk Phos (alkaline Phosphatase) 95 89 86 80 99 99  Albumin 4.1 4.1 4.0 3.9 4.1 3.9  Bilirubin, Total 0.3 0.4 0.5 0.5 0.5 0.4  Protein, Total 7.5 7.4 7.2 6.7 7.4 7.0  A/G Ratio 1.2 1.2 1.3 1.4 1.2 1.3   Component 12/02/20 05/27/20 11/23/19 05/25/19  01/25/19 09/25/18  Cholesterol, Total 187 182 201 High  189 190 206 High   Triglyceride 183 131 200 High  201 High  213 High  204 High   HDL (High Density Lipoprotein) Cholesterol 47.2 50.3 46.1 44.0 43.9 44.9  LDL Calculated 103 106 115 105 104 120  VLDL Cholesterol 37 26 40 40 43 41  Cholesterol/HDL Ratio 4.0 3.6 4.4 4.3 4.3 4.6  Last BP 118/60 HR 79 12/09/20  Noted in care everywhere rosuvastatin Rx changed to $RemoveBe'40mg'wkmjFCAvu$  po qhs on 12/09/20 #90 RF1.  Cancelled atorvastatin dispense from [facility] and medication returned to stock.  Patient notified new Rx required prior to next fill.

## 2021-05-20 ENCOUNTER — Ambulatory Visit
Admit: 2021-05-20 | Discharge: 2021-05-20 | Payer: PRIVATE HEALTH INSURANCE | Attending: Family | Primary: Internal Medicine

## 2021-05-20 DIAGNOSIS — J101 Influenza due to other identified influenza virus with other respiratory manifestations: Secondary | ICD-10-CM

## 2021-05-20 LAB — POCT INFLUENZA A/B DNA
Influenza virus A RNA: POSITIVE
Influenza virus B RNA: NEGATIVE

## 2021-05-20 MED ORDER — BENZONATATE 200 MG PO CAPS
200 MG | ORAL_CAPSULE | Freq: Three times a day (TID) | ORAL | 0 refills | Status: AC | PRN
Start: 2021-05-20 — End: 2021-05-27

## 2021-05-20 NOTE — Addendum Note (Signed)
Addended byTrudie Buckler ANN on: 05/20/2021 10:25 AM     Modules accepted: Orders

## 2021-05-20 NOTE — Progress Notes (Signed)
Linda Jacobs (DOB:  May 24, 1961) is a 60 y.o. female,New patient, here for evaluation of the following chief complaint(s):  Cough (Coughing all night long), Fever, and Congestion  Patient stated symptoms started Monday.        ASSESSMENT/PLAN:  1. Influenza A  -     POCT Influenza A/B DNA  POCT Influenza A- positive and results reviewed with the patient.     OTC Tylenol/Motrin   Hydrate and rest.   Monitor for respiratory distress and dehydration.   OTC throat lozenges  Follow up with PCP  Follow up with any worsening of symptom.     Discussed with patient Tamiflu and patient declined prescription.          Subjective   SUBJECTIVE/OBJECTIVE:    History provided by:  Patient (Spouse present in room)  Language interpreter used: No    Cough  The current episode started in the past 7 days. The problem has been unchanged. The problem occurs every few minutes. The cough is Non-productive. Associated symptoms include chest pain, chills, ear pain, a fever, headaches, myalgias, a sore throat, shortness of breath and sweats. Pertinent negatives include no nasal congestion, postnasal drip, rash, rhinorrhea or wheezing. Associated symptoms comments: Right ear pain, chest pain with cough.. The symptoms are aggravated by lying down. She has tried OTC cough suppressant (OTC Tylenol) for the symptoms. The treatment provided mild relief.   Fever   This is a new problem. The current episode started in the past 7 days. Episode frequency: fever x 1 stated 99.8. Maximum temperature: 99.8. The temperature was taken using an oral thermometer. Associated symptoms include abdominal pain, chest pain, coughing, ear pain, headaches, muscle aches, sleepiness and a sore throat. Pertinent negatives include no congestion, diarrhea, nausea, rash, urinary pain, vomiting or wheezing. Associated symptoms comments: Patient stated abdominal pain relates to coughing. She has tried acetaminophen for the symptoms. The treatment provided mild relief.      Review of Systems   Constitutional:  Positive for chills and fever.   HENT:  Positive for ear pain and sore throat. Negative for congestion, postnasal drip and rhinorrhea.    Eyes: Negative.    Respiratory:  Positive for cough and shortness of breath. Negative for wheezing.    Cardiovascular:  Positive for chest pain.   Gastrointestinal:  Positive for abdominal pain. Negative for diarrhea, nausea and vomiting.   Genitourinary:  Negative for dysuria.   Musculoskeletal:  Positive for myalgias.   Skin:  Negative for rash.   Allergic/Immunologic: Negative.    Neurological:  Positive for dizziness, light-headedness and headaches.        Dizzy and light head at times when she gets up from bed   Hematological: Negative.         Objective   Physical Exam  Constitutional:       Appearance: Normal appearance.   HENT:      Head: Normocephalic.      Right Ear: Tympanic membrane normal.      Left Ear: Tympanic membrane normal.      Nose: Nose normal.      Comments: Erythema noted bilateral      Mouth/Throat:      Mouth: Mucous membranes are moist.      Pharynx: Oropharynx is clear.   Cardiovascular:      Rate and Rhythm: Normal rate and regular rhythm.   Pulmonary:      Effort: Pulmonary effort is normal.      Breath sounds: Rhonchi  present.      Comments: Rhonchi noted bronchi bilateral.  Musculoskeletal:         General: Normal range of motion.      Cervical back: Normal range of motion.   Neurological:      Mental Status: She is alert and oriented to person, place, and time.   Psychiatric:         Mood and Affect: Mood normal.         Behavior: Behavior normal.                An electronic signature was used to authenticate this note.    --Stephani Police, APRN - CNP

## 2021-05-20 NOTE — Patient Instructions (Addendum)
OTC Tylenol/Motrin   Hydrate and rest.   Monitor for respiratory distress and dehydration.   OTC throat lozenges  Follow up with PCP  Follow up with any worsening of symptom.   Continue taking Victoza as prescribed by your provider.

## 2021-06-09 ENCOUNTER — Telehealth: Payer: Self-pay | Admitting: Registered Nurse

## 2021-06-09 DIAGNOSIS — K2100 Gastro-esophageal reflux disease with esophagitis, without bleeding: Secondary | ICD-10-CM

## 2021-06-09 NOTE — Telephone Encounter (Signed)
Patient last filled omeprazole 40mg  po daily #90 RF0 03/19/21 no refills remaining on Rx.  RN 05/19/21 faxed refill request to patient [facility] office still awaiting reply.  Bridge refill today given from PDRx #90 RF0.  Patient notified needs new Rx from [facility] for next fill.  Patient verbalized understanding information/instructions, agreed with plan of care and had no further questions at this time.

## 2021-06-11 NOTE — Telephone Encounter (Signed)
Rx request returned with approval for Omeprazole DR 40mg  po daily #90 RF1. Placed in paper chart.

## 2021-06-12 NOTE — Telephone Encounter (Signed)
Noted new PCM Rx received see paper chart at [facility] for omeprazole 40mg  DR po daily

## 2021-07-01 ENCOUNTER — Telehealth: Payer: Self-pay | Admitting: Registered Nurse

## 2021-07-01 DIAGNOSIS — U071 COVID-19: Secondary | ICD-10-CM

## 2021-07-01 MED ORDER — BENZONATATE 200 MG PO CAPS: 200.0000 mg | ORAL_CAPSULE | Freq: Three times a day (TID) | ORAL | 0 refills | Status: AC | PRN

## 2021-07-01 MED ORDER — MOLNUPIRAVIR EUA 200MG CAPSULE: 4.0000 | ORAL_CAPSULE | Freq: Two times a day (BID) | ORAL | 0 refills | Status: AC

## 2021-07-01 MED ORDER — ALBUTEROL SULFATE HFA 108 (90 BASE) MCG/ACT IN AERS: 1.0000 | INHALATION_SPRAY | RESPIRATORY_TRACT | 0 refills | Status: AC | PRN

## 2021-07-01 NOTE — Telephone Encounter (Signed)
[employer] Alexis notified NP that pt reported positive home test 07/01/2021  Patient contacted via telephone she reported symptoms started last night after work 06/30/21 fatigue went to bed and when she woke up headache, body aches and cough.  Pt began quarantine at that time. Patient did not develop symptoms of  trouble breathing, chest pain, nausea, vomiting, diarrhea, sore throat, fever or chills, wheezing or coughing until throwing up at this time. Medication reconciliation performed and patient does not take baby aspirin on days she takes BC powder.  Patient prn use tylenol, albuterol, tramadol, diclofenac gel.  Has not used in the previous week.  Has not taken omega 3 today.   Patient reported she did get bivalent covid booster this fall.  Known sick contact at work [employer].) recent positive covid test.    5 day quarantine per [facility] recommendations. Day 1 of quarantine 07/01/21. Day 5 07/05/21 RTW 07/06/21 no eating in employee lunch room and strict mask wear through 07/11/21.  Patient to contact me if vomiting after coughing or unable to tolerate po fluids.  Discussed flu and other viral illnesses circulating in community and some causing GI upset.  If GI upset I have recommended clear fluids then bland diet.  Avoid dairy/spicy, fried and large portions of meat while having nausea.  If vomiting hold po intake x 1 hour.  Then sips clear fluids like broths, ginger ale, power ade, gatorade, pedialyte may advance to soft/bland if no vomiting x 24 hours and appetite returned otherwise hydration main focus. Call me at work from home number if symptoms not improved with plan of care  patient to call if high fever, dehydration, marked weakness, fainting, increased abdominal pain, blood in stool or vomit (red or black).     Reviewed possible Covid symptoms including cough, shortness of breath with exertion or at rest, runny nose, congestion, sinus pain/pressure, sore throat, fever/chills, body aches, fatigue, loss  of taste/smell, GI symptoms of nausea/vomiting/diarrhea. Also reviewed same day/emergent eval/ER precautions of dizziness/syncope, confusion, blue tint to lips/face, severe shortness of breath/difficulty breathing/wheezing.     Patient to isolate in own room and if possible use only one bathroom if living with others in home.  Wear mask when out of room to help prevent spread to others in household.  Sanitize high touch surfaces with lysol/chlorox/bleach spray or wipes daily as viruses are known to live on surfaces from 24 hours to days.  Discussed patient at moderate risk for severe covid.  Discussed covid antiviral therapy with patient.  She would like to proceed. Per epocrates drug interactions with paxlovid atorvastatin, omeprazole, and BC powder.  No interactions noted with molnupiravir.   FDA handout patient molnupiravir sent to my chart and discussed most common side effects GI upset and dizziness.  Discussed bad taste in mouth not uncommon either take with full glass of water and rinse mouth afterwards.  Consider taking with lemonade reports helped other patients.  Electronic Rx molnupiravir 200mg  take 4 po BID x 5 days #40 RF0 sent to patient pharmacy of choice.  Patient has used albuterol inhaler and tessalon pearles in the past and helped with cough.  Electronic Rx sent albuterol 108mcg/act 1-2 puffs po q4-6h prn cough #1 RF0 and tessalon pearles 200mg  po TID prn cough #30 RF0 to her pharmacy of choice.   [employer] handouts sent to patient my chart on covid home care and quarantine.  Patient has BC powder at home for prn use denied cough/cold or nose spray OTC  at home.  May use flonase nasal 1 spray each nostril BID prn rhinitis.  Dayquil and nyquil per manufacturer instructions. Honey 1 tablespoon every 4 hours is a natural cough suppressant.  Avoid dehydration and drink water to keep urine pale yellow clear and voiding every 2-4 hours while awake.  Patient alert and oriented x3, spoke full  sentences without difficulty.  Sounds fatigued.  No audible cough/congestion/throat clearing during 12 minute telephone call.  Discussed with patient she can contact me at this number [phone] this weekend or when clinic closed if questions or concerns until RN Rolly Salter returns to clinic tomorrow [phone].  RN Rolly Salter in clinic M, Tu, Th and F.   Pt verbalized understanding and agreement with plan of care. No further questions/concerns at this time. Pt reminded to contact clinic with any changes in symptoms or questions/concerns. HR notified patient to quarantine through Day 5 RTW estimated Day 6 07/06/21 with strict mask wear through Day 10 1/27 and no eating in employee lunch room.  Discussed may use one of two rooms by clinic for eating if inclement weather and cannot eat outside/in car.

## 2021-07-03 NOTE — Telephone Encounter (Signed)
Spoke with pt by phone. She reports dizziness upon standing. Drinking chicken broth and water. Having muscle spasms. C/o HA and body aches intermittent.  Taking molnupiravir, side effect diarrhea. Denies bad or metallic taste. Has used albuterol inh once. Has not used tessalon pearles. Cough not frequent and not keeping her up at night. Taking ibuprofen, not BC or Dayquil/nyquil.  Does not feel she will be able to RTW Monday. Advised clinic staff will f/u with her over the weekend and if unable to return will notify HR and then f/u daily until return. Pt verbalizes agreement. Denies needs or concerns at this time.

## 2021-07-03 NOTE — Telephone Encounter (Signed)
Noted dizziness with standing tolerating po intake.  Will follow up with patient via telephone this weekend.

## 2021-07-04 NOTE — Telephone Encounter (Signed)
Patient contacted via telephone and reported she is short of breath with exertion and having dizziness with standing and with sitting.  Varies per patient not constantly.  Has been using her albuterol inhaler.  Last use 90 minutes ago.  Patient reported she has dull pain in the middle of her back also.  Patient denied productive cough, fever, chills, vomiting.  Still has diarrhea, body aches (legs feel like rubber).  She has been drinking water, orange juice and chicken broth.  Denied blue lips/face/confusion.  My head hurts.  I have taken ibuprofen and it dulls the headache.  Has lost all of her taste/smell today.  Her daughter has ended up testing positive also. Encouraged her to drink 8oz fluids per hour when awake preferred broths/soups/gatorade/powerade due to diarrhea.   Discussed I will call her again tomorrow for re-evaluation.  She is to contact me at [phone] if worsening symptoms today.  Patient A&Ox3 spoke full sentences without difficulty; nasal congestion/sniffing audible during 7 minute telephone call.  No audible cough or throat clearing.  HR notified re-evaluation 1/22.  Patient verbalized understanding information/instructions, agreed with plan of care and had no further questions at this time.

## 2021-07-05 NOTE — Telephone Encounter (Signed)
Patient contacted via telephone and stated drank broth, water and juice yesterday along with toast, banana, crackers.  Denied further episodes of diarrhea.  Feeling a bit better today.  Dizziness improved only occurring with standing now.  Has used inhaler once today.  Patient does not feel ready to return to work not doing much activity at this time at home and gets short of breath with activity and the dizziness occurs.  Discussed RN Rolly Salter will follow up with her tomorrow.  Patient reported back hurting from lying in bed.  She is trying to get up to get her snacks from kitchen.  Bathroom attached to her room.  Discussed sitting up in chair, heel pumps to help prevent blood clots.  Continue hydrating today and po intake solids bland as tolerated.  She stated banana made her feel queasy yesterday.  Patient A&Ox3 spoke full sentences without difficulty.  No audible wheeze/congestion/cough/throat clearing during 5 minute telephone call.  HR notified re-evaluation 07/06/21.  Patient verbalized understanding information/instructions, agreed with plan of care and had no further questions at this time.

## 2021-07-06 NOTE — Telephone Encounter (Signed)
Reviewed RN Haley note agreed with plan of care. 

## 2021-07-06 NOTE — Telephone Encounter (Signed)
Spoke with pt by phone. She reports feeling a little better today. Moving around more but still having mild dizziness. Fatigue persists. Does not feel ready to RTW tomorrow and would like to try for Wednesday instead. Advised pt this is fine, to to stay hydrated, bland BRAT diet to advance as tolerated. Will re-eval tomorrow.

## 2021-07-07 NOTE — Telephone Encounter (Signed)
Reviewed RN Haley note agreed with plan of care. 

## 2021-07-07 NOTE — Telephone Encounter (Signed)
Spoke with pt by phone. She reports feeling better today. Fatigue still present. Wants to try to come to work tomorrow but not sure if she will make it through entire shift. HR advised of same since clinic closed on Wednesdays. Pt agreeable and denies further questions or concerns. Strict mask use thru 1/28.

## 2021-07-08 ENCOUNTER — Encounter: Payer: Self-pay | Admitting: Registered Nurse

## 2021-07-08 NOTE — Telephone Encounter (Signed)
Contacted patient via telephone.  Started to feel weak at work, got nervous then nausea started left work soon after arrival.  Patient thinks due to not being able to tolerate much solid food and only drinking liquids up to today except toast/crackers.  Trying plain rice today and stomach is starting to settle.  Denied emesis or diarrhea.  Would like to stay home tomorrow to continue working on solid po intake options.  Discussed soup with noodles, plain pasta noodles, baked or mashed potatoes, eggs.  Patient stated can't do eggs and doesn't like texture mashed potatoes but will try others.  HR notified re-evaluation 1/26 for RTW 1/27  Day 10 1/28.  Encouraged patient to rest and hydrate this afternoon.  Bland soft diet.  Refused zofran Rx.  Discussed ginger or chamomile tea can be helpful for nausea also.  Patient A&Ox3 spoke full sentences without difficulty; no cough/congestion/throat clearing audible during 5 minute telephone call.  Patient verbalized understanding information/instructions, agreed with plan of care and had no further questions at this time.

## 2021-07-09 NOTE — Telephone Encounter (Signed)
Reviewed RN Haley note agreed with plan of care. 

## 2021-07-09 NOTE — Telephone Encounter (Signed)
Pt reports she is feeling a little better today. Eating bland solid foods and feels this is helping her stomach stay settled. She is going to report to work tomorrow and hope to make it through entire shift but advised pt if unable to, notify supervisor like she did yesterday and clinic will f/u again. She verbalizes understanding and denies needs or concerns.

## 2021-07-10 NOTE — Telephone Encounter (Signed)
Called pt for check in and confirm at work today. No answer. LVM.

## 2021-07-11 NOTE — Telephone Encounter (Signed)
Patient contacted via telephone.  A&Ox3 spoke full sentences without difficulty.  Reported returned to work 1/27 and things a little slower but able to perform all duties.  Strict mask wear through Day 10 1/28 today then may discontinue unless cough/sneeze then to continue wear until symptoms resolved.  Patient reported has been able to advance diet some to include eggs now too but taste still off and food doesn't taste good to eat.  Denied n/v/d/fever.  No audible cough/congestion/throat clearing during 3 minute call.  Patient stated she is going to continue mask wear at work due to high covid levels.  Discussed with patient it is a good practice until spring arrives and warehouse doors open more often/fresh air/community spread rate decreases as reinfection with another strain possible.  Patient verbalized understanding information/instructions, agreed with plan of care and had no further questions at this time.  HR notified patient RTW 1/27 symptoms improving encounter closed.

## 2021-09-11 ENCOUNTER — Telehealth: Payer: Self-pay | Admitting: *Deleted

## 2021-09-11 NOTE — Telephone Encounter (Signed)
LMTC to schedule Yearly Lung CA CT Scan. 

## 2021-10-26 ENCOUNTER — Encounter
Admit: 2021-10-26 | Discharge: 2021-10-26 | Payer: PRIVATE HEALTH INSURANCE | Attending: Obstetrics & Gynecology | Primary: Internal Medicine

## 2021-10-26 DIAGNOSIS — Z01419 Encounter for gynecological examination (general) (routine) without abnormal findings: Secondary | ICD-10-CM

## 2021-10-26 NOTE — Progress Notes (Signed)
Subjective:      Patient ID: Linda PrinceLisa Jacobs is a 61 y.o. female.    Patient is here for annual. Patient in menopause. Patient suffering from sciatica.    Gynecologic Exam      Review of Systems   Constitutional: Negative.    HENT: Negative.     Eyes: Negative.    Respiratory: Negative.     Cardiovascular: Negative.    Gastrointestinal: Negative.    Endocrine: Negative.    Genitourinary:  Positive for pelvic pain.   Musculoskeletal:  Positive for back pain.   Skin: Negative.    Allergic/Immunologic: Negative.    Neurological: Negative.    Hematological: Negative.    Psychiatric/Behavioral: Negative.       Date of Birth [birth year]  Past Medical History:   Diagnosis Date    Diabetes mellitus (HCC)     High cholesterol     HPV (human papilloma virus) infection     HTN (hypertension)     Sciatica of right side      Past Surgical History:   Procedure Laterality Date    CESAREAN SECTION      LAPAROSCOPY      infertility     OB History   Gravida Para Term Preterm AB Living   1 1 1          SAB IAB Ectopic Molar Multiple Live Births             1      # Outcome Date GA Lbr Len/2nd Weight Sex Delivery Anes PTL Lv   1 Term 11/02/01    F [id number]        Social History     Socioeconomic History    Marital status: Married     Spouse name: Not on file    Number of children: Not on file    Years of education: Not on file    Highest education level: Not on file   Occupational History    Not on file   Tobacco Use    Smoking status: Never    Smokeless tobacco: Never   Substance and Sexual Activity    Alcohol use: Yes     Comment: occ.    Drug use: No    Sexual activity: Never   Other Topics Concern    Not on file   Social History Narrative    Not on file     Social Determinants of Health     Financial Resource Strain: Not on file   Food Insecurity: Not on file   Transportation Needs: Not on file   Physical Activity: Not on file   Stress: Not on file   Social Connections: Not on file   Intimate Partner Violence: Not on file   Housing  Stability: Not on file     Allergies   Allergen Reactions    Latex Rash     blisters    Pcn [Penicillins] Hives     Outpatient Medications Marked as Taking for the 10/26/21 encounter (Office Visit) with Estill Battenaroline J Vianney Kopecky, MD   Medication Sig Dispense Refill    naproxen sodium (ANAPROX) 550 MG tablet Take by mouth 2 times daily as needed      Liraglutide (VICTOZA) 18 MG/3ML SOPN SC injection Inject 18 mg into the skin daily      aspirin 81 MG tablet Take 1 tablet by mouth daily      empagliflozin (JARDIANCE) 25 MG tablet Take 1 tablet by mouth daily  metFORMIN (GLUCOPHAGE) 500 MG tablet Take 1,000 tablets by mouth daily      lisinopril (PRINIVIL;ZESTRIL) 5 MG tablet Take 1 tablet by mouth daily      atorvastatin (LIPITOR) 20 MG tablet Take 1 tablet by mouth daily       Family History   Problem Relation Age of Onset    Breast Cancer Mother     Lung Cancer Father     Kidney Cancer Father     Ovarian Cancer Maternal Grandmother     Stomach Cancer Maternal Grandfather     Thyroid Cancer Paternal Grandmother     Lung Cancer Paternal Grandfather      BP 129/76 (Site: Right Upper Arm, Position: Sitting, Cuff Size: Large Adult)   Pulse 77   Resp 18   Ht 5\' 9"  (1.753 m)   Wt 274 lb 6.4 oz (124.5 kg)   LMP  (LMP Unknown)   SpO2 98%   BMI 40.52 kg/m       Objective:   Physical Exam  Constitutional:       General: She is not in acute distress.     Appearance: Normal appearance. She is well-developed and normal weight. She is not diaphoretic.   HENT:      Head: Normocephalic and atraumatic.      Nose: Nose normal.      Mouth/Throat:      Mouth: Mucous membranes are moist.      Pharynx: Oropharynx is clear.   Eyes:      Extraocular Movements: Extraocular movements intact.   Neck:      Thyroid: No thyromegaly.   Cardiovascular:      Rate and Rhythm: Normal rate and regular rhythm.      Heart sounds: Normal heart sounds. No murmur heard.    No friction rub. No gallop.   Pulmonary:      Effort: Pulmonary effort is normal.  No respiratory distress.      Breath sounds: Normal breath sounds. No wheezing or rales.   Chest:   Breasts:     Right: Normal. No swelling, bleeding, inverted nipple, mass, nipple discharge, skin change or tenderness.      Left: Normal. No swelling, bleeding, inverted nipple, mass, nipple discharge, skin change or tenderness.   Abdominal:      General: Abdomen is flat. Bowel sounds are normal. There is no distension.      Palpations: Abdomen is soft. There is no hepatomegaly or mass.      Tenderness: There is no abdominal tenderness. There is no guarding or rebound.      Hernia: No hernia is present. There is no hernia in the left inguinal area.   Genitourinary:     General: Normal vulva.      Exam position: Lithotomy position.      Pubic Area: No rash.       Labia:         Right: No rash, tenderness, lesion or injury.         Left: No rash, tenderness, lesion or injury.       Urethra: No prolapse, urethral pain, urethral swelling or urethral lesion.      Vagina: Normal. No signs of injury and foreign body. No vaginal discharge, erythema, tenderness or bleeding.      Cervix: No cervical motion tenderness, discharge, friability, lesion, erythema, cervical bleeding or eversion.      Uterus: Not deviated, not enlarged, not fixed, not tender and no uterine prolapse.  Adnexa:         Right: No mass, tenderness or fullness.          Left: No mass, tenderness or fullness.        Rectum: Normal. Guaiac result negative. No mass, tenderness, anal fissure, external hemorrhoid or internal hemorrhoid. Normal anal tone.      Comments: Normal urethral meatus, nl urethra, nl bladder.    Musculoskeletal:         General: No swelling, tenderness, deformity or signs of injury. Normal range of motion.      Cervical back: Normal range of motion and neck supple. No rigidity.   Lymphadenopathy:      Cervical: No cervical adenopathy.      Lower Body: No right inguinal adenopathy. No left inguinal adenopathy.   Skin:     General: Skin  is warm and dry.      Coloration: Skin is not jaundiced or pale.      Findings: No bruising, erythema, lesion or rash.   Neurological:      General: No focal deficit present.      Mental Status: She is alert and oriented to person, place, and time.      Deep Tendon Reflexes: Reflexes are normal and symmetric.   Psychiatric:         Mood and Affect: Mood normal.         Behavior: Behavior normal.         Thought Content: Thought content normal.         Judgment: Judgment normal.       Assessment:      Annual  Menopause  Pelvic cramping  Family hx ovarian cancer  Back pain  Varicose veins with pain right      Plan:      Pap, calcium, exercise, mammogram, hemocult negative  Stable  And 4. Pelvic US  5.  Referral to back MD  6. Referral to vascular MD        Estill Batten, MD

## 2021-12-03 ENCOUNTER — Encounter: Payer: Self-pay | Admitting: Registered Nurse

## 2021-12-03 ENCOUNTER — Ambulatory Visit: Payer: Self-pay | Admitting: Registered Nurse

## 2021-12-03 VITALS — BP 143/95 | HR 92 | Resp 18

## 2021-12-03 DIAGNOSIS — H5712 Ocular pain, left eye: Secondary | ICD-10-CM

## 2021-12-03 MED ORDER — CARBOXYMETHYLCELLULOSE SOD PF 0.5 % OP SOLN: 1.0000 [drp] | Freq: Three times a day (TID) | OPHTHALMIC | 0 refills | Status: AC | PRN

## 2021-12-03 NOTE — Progress Notes (Signed)
Subjective:    Patient ID: Mariah Sutton, female    DOB: [birth year], 61 y.o.   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  She had already performed eye wash station without relief still painful left medial corner/tearing denied visual changes but still feels like something in her eye.        Review of Systems  Constitutional:  Negative for activity change, appetite change, chills, diaphoresis, fatigue and fever.  HENT:  Positive for trouble swallowing. Negative for congestion, facial swelling and voice change.   Eyes:  Positive for photophobia, pain and redness. Negative for discharge, itching and visual disturbance.  Respiratory:  Negative for cough, choking, shortness of breath, wheezing and stridor.   Cardiovascular:  Negative for chest pain.  Gastrointestinal:  Negative for diarrhea, nausea and vomiting.  Endocrine: Negative for cold intolerance and heat intolerance.  Genitourinary:  Negative for difficulty urinating.  Musculoskeletal:  Negative for gait problem, neck pain and neck stiffness.  Skin:  Positive for color change and rash. Negative for pallor and wound.  Neurological:  Negative for dizziness, tremors, seizures, syncope, facial asymmetry, speech difficulty, weakness, light-headedness, numbness and headaches.  Hematological:  Negative for adenopathy. Does not bruise/bleed easily.  Psychiatric/Behavioral:  Negative for agitation, confusion and sleep disturbance.        Objective:   Physical Exam Vitals and nursing note reviewed.  Constitutional:      General: She is awake. She is not in acute distress.    Appearance: Normal appearance. She is well-developed and well-groomed. She is obese. She is not ill-appearing, toxic-appearing or diaphoretic.  HENT:     Head: Normocephalic and atraumatic. No right periorbital erythema or left periorbital erythema.      Jaw: There is normal jaw occlusion.     Salivary Glands: Right salivary gland is not diffusely enlarged. Left salivary gland is not diffusely enlarged.     Right Ear: Hearing and external ear normal.     Left Ear: Hearing and external ear normal.     Nose: Nose normal. No nasal tenderness, mucosal edema, congestion or rhinorrhea.     Right Sinus: No maxillary sinus tenderness or frontal sinus tenderness.     Left Sinus: No maxillary sinus tenderness or frontal sinus tenderness.     Mouth/Throat:     Lips: Pink. No lesions.     Mouth: Mucous membranes are moist. No oral lesions or angioedema.     Dentition: No gum lesions.     Pharynx: Oropharynx is clear.  Eyes:     General: Lids are normal. Vision grossly intact. Gaze aligned appropriately. Allergic shiner present. No scleral icterus.       Right eye: No foreign body, discharge or hordeolum.        Left eye: No foreign body, discharge or hordeolum.     Extraocular Movements: Extraocular movements intact.     Right eye: Normal extraocular motion and no nystagmus.     Left eye: Normal extraocular motion and no nystagmus.     Conjunctiva/sclera:     Right eye: Right conjunctiva is not injected. No chemosis, exudate or hemorrhage.    Left eye: Left conjunctiva is injected. No chemosis, exudate or hemorrhage.    Pupils: Pupils are equal, round, and reactive to light.      Comments: Upper and lower eyelids with nonpitting edema left 1-2+/4; tearing left only; t-shirt  with large wet spots on front not dripping; tear duct swollen left; woods lamp fluorescein stain exam performed no foreign body visualized; scant stain uptake bulbar conjunctiva medial and lateral to cornea but no corneal uptake; photophobia left eye on exam squinting with light pupils PERRL 4-5/10 bilaterally; full EOM bilaterally; patient has reading glasses with her  Neck:     Trachea: Trachea and phonation normal. No tracheal deviation.  Cardiovascular:     Rate and Rhythm:  Normal rate and regular rhythm.     Pulses: Normal pulses.          Radial pulses are 2+ on the right side and 2+ on the left side.  Pulmonary:     Effort: Pulmonary effort is normal. No respiratory distress.     Breath sounds: Normal breath sounds and air entry. No stridor or transmitted upper airway sounds. No wheezing.     Comments: Spoke full sentences without difficulty; no cough observed in exam room Abdominal:     General: Abdomen is flat.  Musculoskeletal:        General: Normal range of motion.     Right hand: Normal strength. Normal capillary refill.     Left hand: Normal strength. Normal capillary refill.     Cervical back: Normal range of motion and neck supple. No swelling, edema, deformity, erythema, signs of trauma, lacerations, rigidity, spasms, tenderness or crepitus. No pain with movement. Normal range of motion.     Thoracic back: No swelling, edema, deformity, signs of trauma, lacerations, spasms or tenderness.  Lymphadenopathy:     Head:     Right side of head: No submandibular or preauricular adenopathy.     Left side of head: No submandibular or preauricular adenopathy.     Cervical: No cervical adenopathy.     Right cervical: No superficial cervical adenopathy.    Left cervical: No superficial cervical adenopathy.  Skin:    General: Skin is warm and dry.     Capillary Refill: Capillary refill takes less than 2 seconds.     Coloration: Skin is not ashen, cyanotic, jaundiced, mottled, pale or sallow.     Findings: Erythema and rash present. No abscess, acne, bruising, burn, ecchymosis, signs of injury, laceration, lesion, petechiae or wound. Rash is macular. Rash is not crusting, nodular, papular, purpuric, pustular, scaling, urticarial or vesicular.     Nails: There is no clubbing.     Comments: Macular erythema upper and lower eyelids left eye most pronounced edges to orbit opening  Neurological:     General: No focal deficit present.     Mental Status: She is  alert and oriented to person, place, and time. Mental status is at baseline.     GCS: GCS eye subscore is 4. GCS verbal subscore is 5. GCS motor subscore is 6.     Cranial Nerves: No cranial nerve deficit, dysarthria or facial asymmetry.     Motor: Motor function is intact. No weakness, tremor, atrophy, abnormal muscle tone or seizure activity.     Coordination: Coordination is intact. Coordination normal.     Gait: Gait is intact. Gait normal.     Comments: In/out of chair and on/off exam table without difficulty; gait sure and steady in clinic; bilateral hand grasp equal 5/5  Psychiatric:        Attention and Perception: Attention and perception normal.        Mood and Affect: Affect normal. Mood is anxious.        Speech: Speech  normal.        Behavior: Behavior normal. Behavior is cooperative.        Thought Content: Thought content normal.        Cognition and Memory: Cognition and memory normal.        Judgment: Judgment normal.           Assessment & Plan:  A-left eye pain acute  P-Unable to visualize foreign body with Wood's lamp fluorescein exam/flipping eyelids.  Discussed with patient conjunctival bulbar and eyelid left along with tear duct swelling/tearing noted left eye in clinic.  Visual acuity DVA without correction 20 minus 1/20 ou/os/od. May continue refresh 2 gtts ou TID prn pain/irritation.  David safety officer/HR/supervisor notified patient work injury by [employer]. Discussed with patient I recommend optometry exam today.   RN Rolly Salter notified patient scheduled for appt with [facility] [place] office 2:10pm  Patient agreed with appt.  Exitcare handout foreign body eye.  Patient verbalized understanding information/instructions, agreed with plan of care and had no further questions at this time.

## 2021-12-03 NOTE — Patient Instructions (Signed)
Eye Foreign Body An eye foreign body is an object on the surface of the eye or in the eyeball that should not be there. The foreign body may be a small speck of dirt or dust, a hair or eyelash, a splinter, a tiny piece of metal, or any other object. A foreign body can injure the eye. It may be found on the outside of the eyeball (extraocular) or inside the eyeball (intraocular). An intraocular foreign body is a medical emergency because it can result in vision loss or loss of the eye. What are the causes? This condition is caused by an object accidentally hitting or entering the eye. A common cause is when a tiny piece of metal is thrown into the air while a person is working with certain tools. What are the signs or symptoms? Symptoms of this condition depend on what the foreign body is, and where it is in the eye. In some cases, a small foreign body may cause few symptoms at first. Foreign bodies are commonly found: On the inner surface of the eyelids or on the covering of the white part of the eye (conjunctiva). A foreign body here may cause: Pain and irritation, especially when blinking. Feeling like something is in the eye. Tearing. Redness. On the surface of the clear covering on the front of the eye (cornea). A foreign body here may cause: Pain and irritation. Small "rust rings" around a metal (metallic) foreign body. Feeling like something is in the eye. Blurry vision. Tearing. Redness. Inside the eyeball. A foreign body here may cause: A lot of pain. Immediate loss of vision or blurry vision. A change in the shape of the black part of the eye (distortion of the pupil). How is this diagnosed? Foreign bodies are found during an exam by an eye care specialist. Foreign bodies on the eyelids, conjunctiva, or cornea are usually (but not always) easily found. When a foreign body is inside the eyeball, cloudiness in the lens of the eye (cataract) may form almost right away. This makes it  hard for an eye specialist to find the foreign body. You may need tests, such as: Ultrasound. X-rays. CT scan. How is this treated? Foreign bodies on the eyelids, conjunctiva, or cornea are often removed easily and painlessly. Your health care provider may do this by washing the eye or using small instruments to take the foreign body out. Treatment may also include: Using numbing (anesthetic) eye drops to relieve pain. Removal of rust in the cornea using a small, drill-like instrument. Antibiotic drops or ointment if the foreign body caused a scratch or scrape (abrasion) of the cornea. Wearing a bandage (eye shield) over your eye. If you have a foreign body inside your eyeball, you will need surgery right away. You may need to be referred to an eye specialist (ophthalmologist) for further treatment. Follow these instructions at home:  Medicines Take over-the-counter and prescription medicines only as told by your health care provider. Use eye drops or ointment as directed. If you were prescribed antibiotic drops or ointment, use it as told by your health care provider. Do not stop using the antibiotic even if your condition improves. General instructions If you have an eye shield: Wear it as directed. Follow instructions from your health care provider about when to remove the shield. Do not drive or use machinery while wearing the shield. If you do not have an eye shield: Keep your eye closed as much as possible. Do not rub your eye. Do  not wear contact lenses until your eye feels normal again, or as instructed by your health care provider. Wear dark sunglasses as needed to protect your eyes from bright light. If you are doing activities with a high risk of eye injury, such as working with high-speed tools, wear protective eye covering. Keep all follow-up visits. This is important. Contact a health care provider if: You have more pain in your eye. You have problems with your eye  shield. You have abnormal fluid (discharge) coming from your eye. Get help right away if: Your vision gets worse. You have more redness and swelling in or around your eye. Summary An eye foreign body is an object on the surface of the eye or in the eyeball that should not be there. A foreign body can injure the eye. It may be found on the outside of the eyeball (extraocular) or inside the eyeball (intraocular). An intraocular foreign body is a medical emergency. This condition is caused by objects that accidentally contact or enter the eye. Examples of these objects include dirt, dust, glass, metal, or an eyelash. Treatment may involve removal of the foreign body by washing the eye, using certain instruments, or surgery. You may need to use antibiotics or wear a bandage (eye shield) over your eye. This information is not intended to replace advice given to you by your health care provider. Make sure you discuss any questions you have with your health care provider. Document Revised: 10/05/2019 Document Reviewed: 10/05/2019 Elsevier Patient Education  2023 [employer].

## 2021-12-07 ENCOUNTER — Telehealth: Payer: Self-pay | Admitting: *Deleted

## 2021-12-07 DIAGNOSIS — H5712 Ocular pain, left eye: Secondary | ICD-10-CM

## 2022-01-05 NOTE — Telephone Encounter (Signed)
Patient reported all symptoms resolved feeling well denied concerns.  A&Ox3 spoke full sentences without difficulty; no erythema/edema eyelids or conjunctival irritation noted.  Encounter closed.

## 2022-04-09 IMAGING — CT CT CHEST LUNG CANCER SCREENING LOW DOSE W/O CM
2 of 5 series · 15 of 40 positions shown, 18 images · non-contrast
Comparison: None.

CLINICAL DATA: 59-year-old asymptomatic female former smoker with
54 pack-year smoking history, quit smoking 5 years prior.

EXAM:
CT CHEST WITHOUT CONTRAST LOW-DOSE FOR LUNG CANCER SCREENING
TECHNIQUE: Multidetector CT imaging of the chest was performed following the
standard protocol without IV contrast.

[Series 3: lung 1.00 · axial · 0.67mm/px · z∈[-1202,-945]mm · 12 of 285 slices shown, 15 images]
[im 14/285  mediastinal]
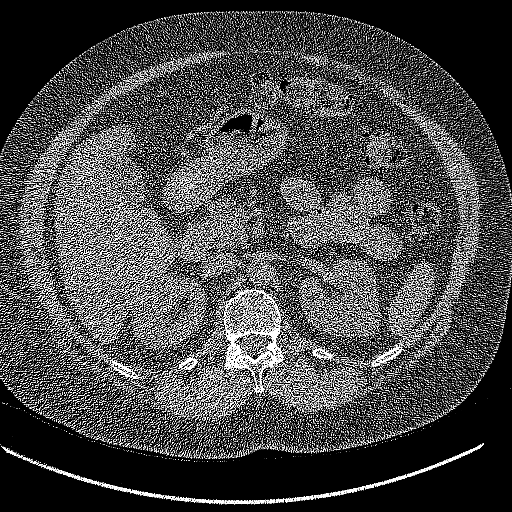
[im 14/285  lung]
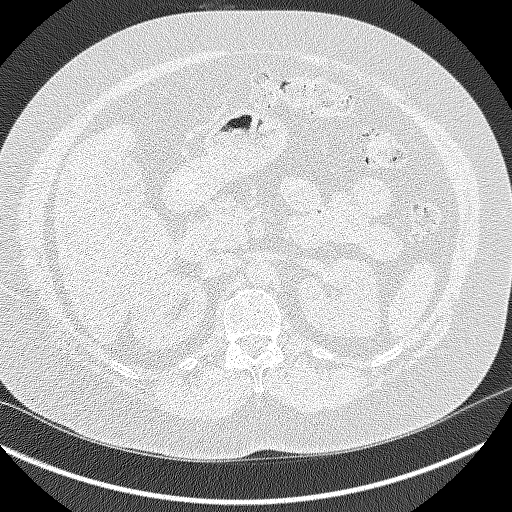
[im 41/285  lung]
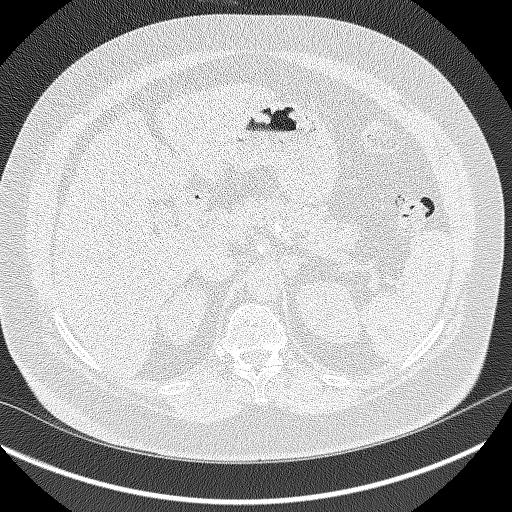
[im 68/285  lung]
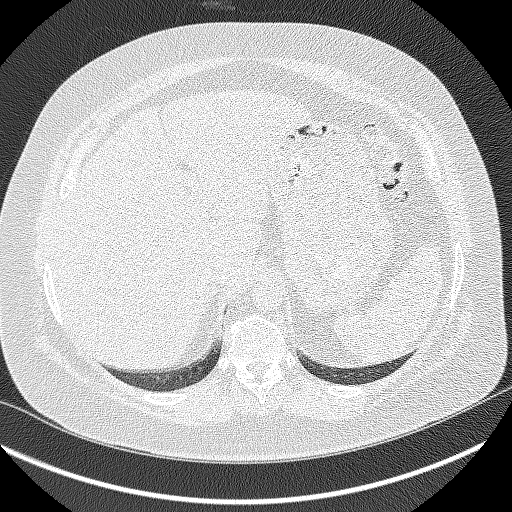
[im 82/285  lung]
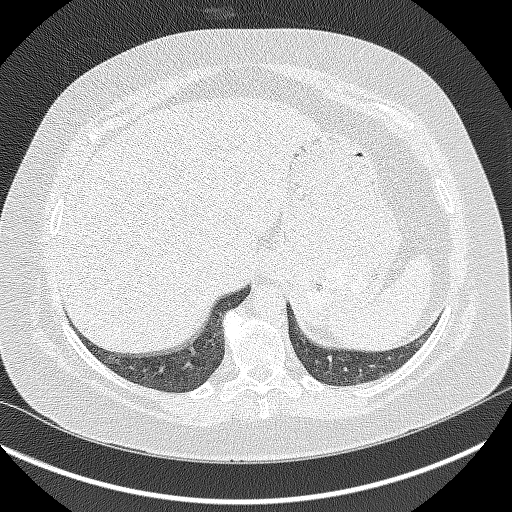
[im 109/285  mediastinal]
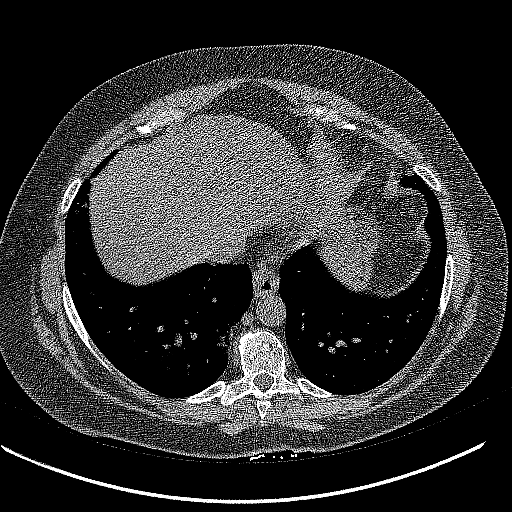
[im 109/285  lung]
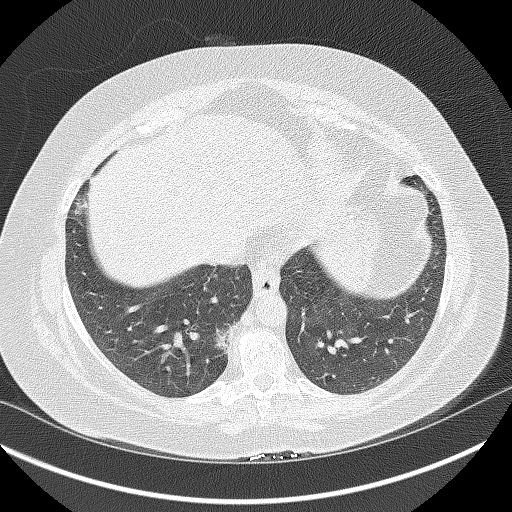
[im 136/285  lung]
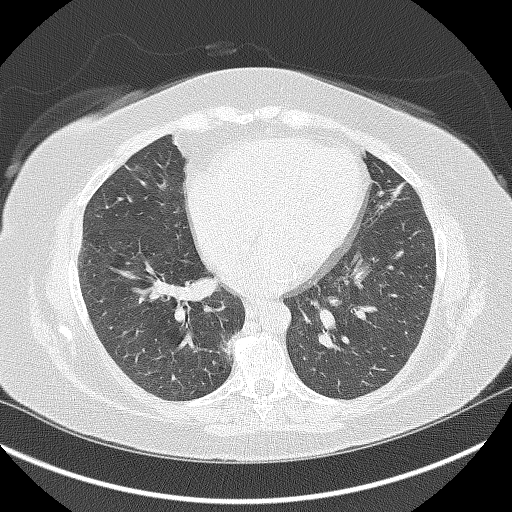
[im 149/285  lung]
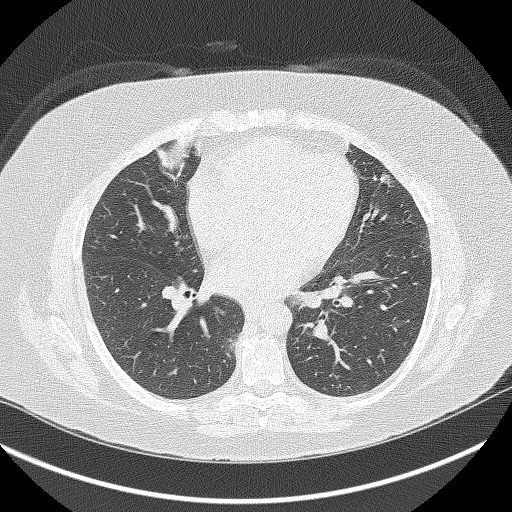
[im 176/285  lung]
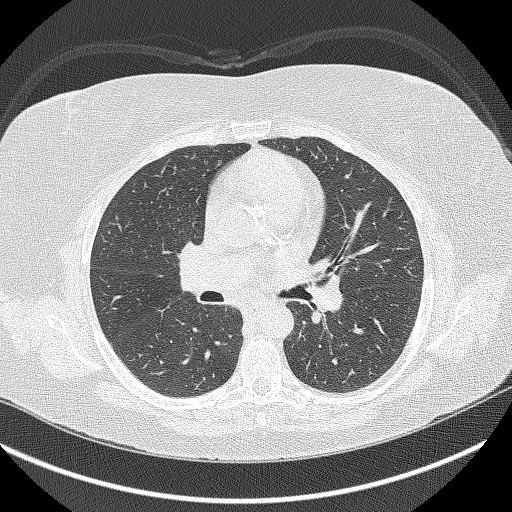
[im 203/285  mediastinal]
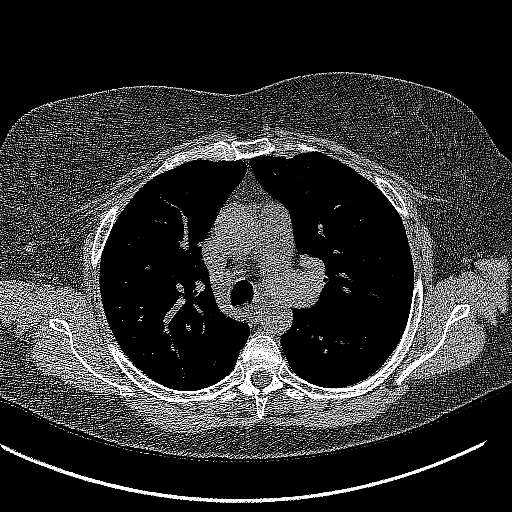
[im 203/285  lung]
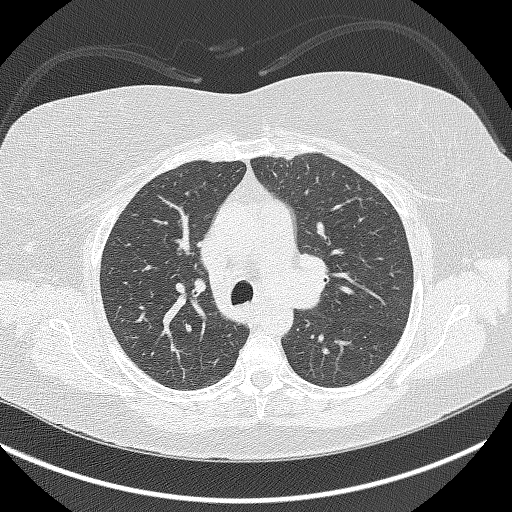
[im 217/285  lung]
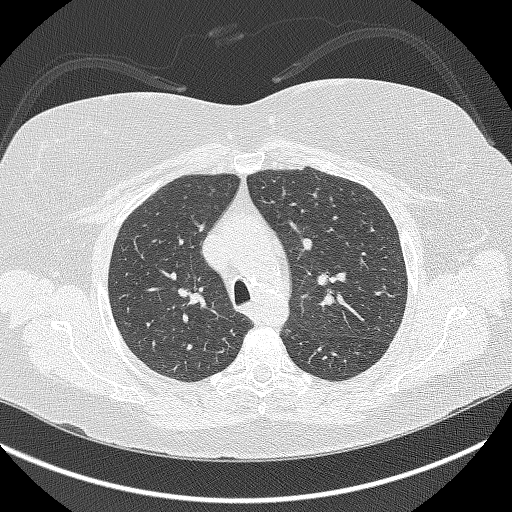
[im 244/285  lung]
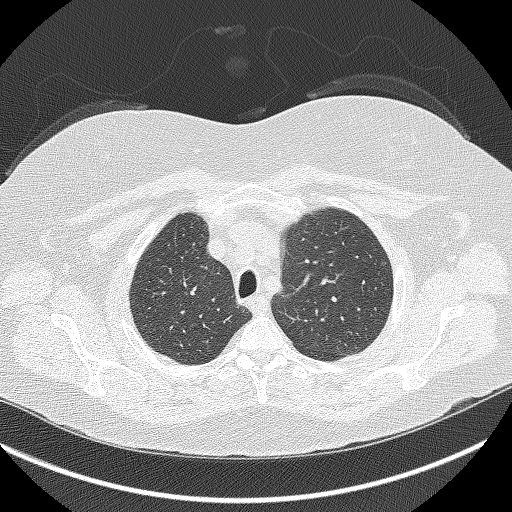
[im 271/285  lung]
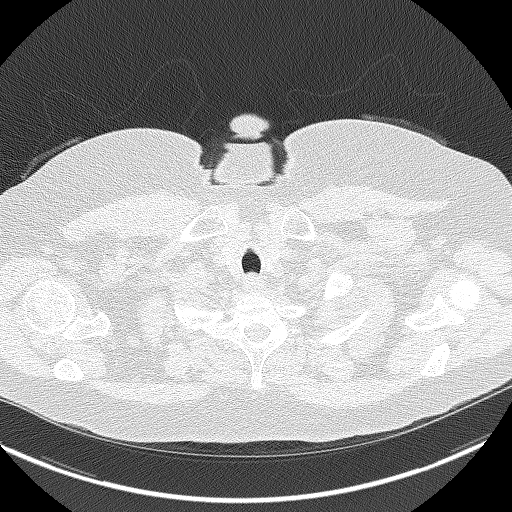

[Series 4: coronals lung 1.00 cor · coronal · 0.56mm/px · 3 of 306 slices shown]
[im 62/306  lung]
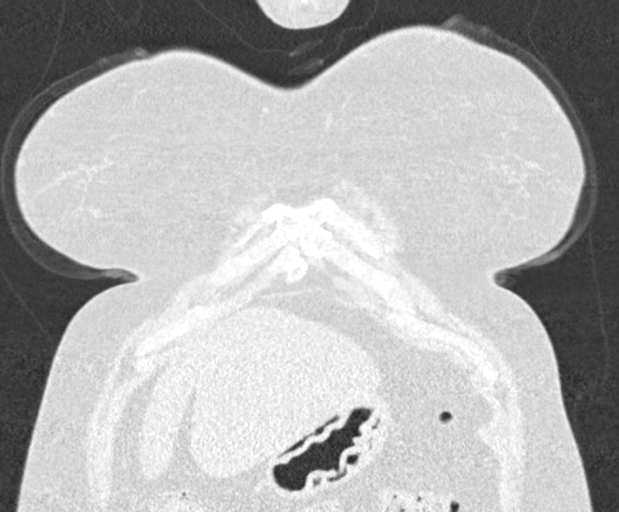
[im 123/306  lung]
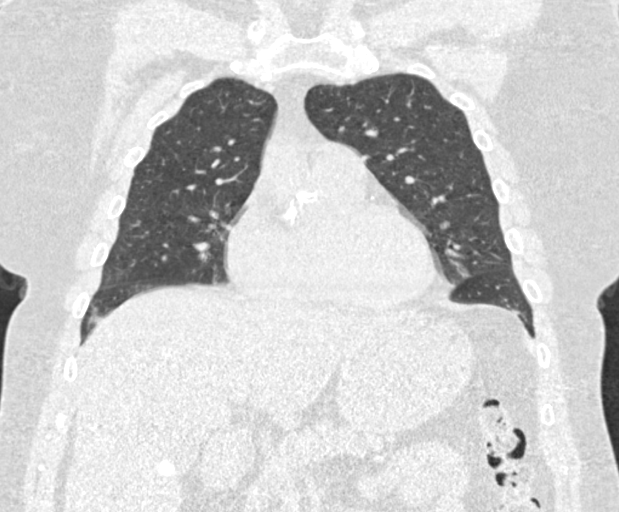
[im 184/306  lung]
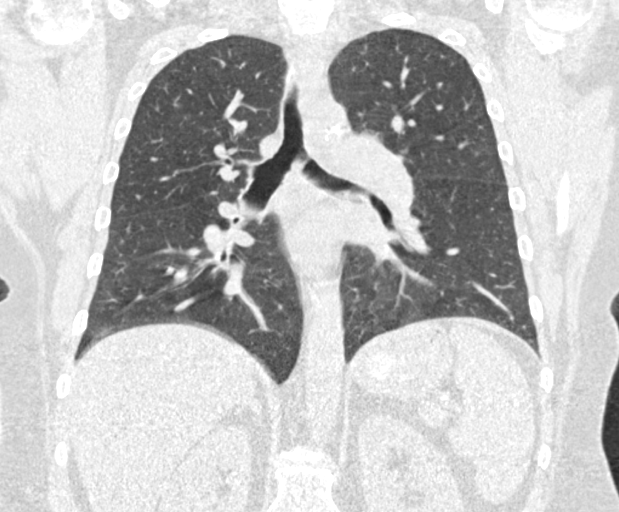

[15 of 40 positions shown; findings below may reference images not displayed]

FINDINGS: Cardiovascular: Normal heart size. No significant pericardial
effusion/thickening. Left anterior descending and left circumflex
coronary atherosclerosis. Atherosclerotic nonaneurysmal thoracic
aorta. Normal caliber pulmonary arteries.

Mediastinum/Nodes: No discrete thyroid nodules. Unremarkable
esophagus. No pathologically enlarged axillary, mediastinal or hilar
lymph nodes, noting limited sensitivity for the detection of hilar
adenopathy on this noncontrast study.

Lungs/Pleura: No pneumothorax. No pleural effusion. Mild
centrilobular emphysema with diffuse bronchial wall thickening. No
acute consolidative airspace disease or lung masses. Patchy
subpleural reticulation and ground-glass opacity in the medial right
lower lobe adjacent to bulky thoracic osteophytes compatible with
osteophyte related fibrosis. A few scattered left pulmonary nodules,
largest 6.3 mm in volume derived mean diameter in the left upper
lobe (series 3/image 63).

Upper abdomen: Small hiatal hernia. Cholelithiasis.

Musculoskeletal: No aggressive appearing focal osseous lesions.
Moderate thoracic spondylosis.
IMPRESSION: 1. Lung-RADS 3, probably benign findings. Dominant 6.3 mm left upper
lobe pulmonary nodule. Short-term follow-up in 6 months is
recommended with repeat low-dose chest CT without contrast (please
use the following order, "CT CHEST LCS NODULE FOLLOW-UP W/O CM").
2. Two-vessel coronary atherosclerosis.
3. Small hiatal hernia.
4. Cholelithiasis.
5. Aortic Atherosclerosis (O17J0-46L.L) and Emphysema (O17J0-KTJ.R).

## 2023-09-01 ENCOUNTER — Ambulatory Visit
Admit: 2023-09-01 | Discharge: 2023-09-01 | Payer: PRIVATE HEALTH INSURANCE | Attending: Physician Assistant | Primary: Internal Medicine

## 2023-09-01 VITALS — BP 137/79 | HR 98 | Temp 97.90000°F | Ht 67.0 in | Wt 276.0 lb

## 2023-09-01 DIAGNOSIS — J069 Acute upper respiratory infection, unspecified: Secondary | ICD-10-CM

## 2023-09-01 MED ORDER — MECLIZINE HCL 25 MG PO TABS
25 | ORAL_TABLET | Freq: Three times a day (TID) | ORAL | 0 refills | Status: AC | PRN
Start: 2023-09-01 — End: 2023-09-11

## 2023-09-01 MED ORDER — PREDNISONE 10 MG PO TABS
10 | ORAL_TABLET | Freq: Two times a day (BID) | ORAL | 0 refills | Status: AC
Start: 2023-09-01 — End: 2023-09-06

## 2023-09-01 NOTE — Progress Notes (Signed)
 Linda Jacobs (DOB: [birth year]) is a 63 y.o. female, Established patient, here for evaluation of the following chief complaint(s):  Congestion (Sinus drainage/runny nose) and Ear Pain (Bilateral ear pain with vertigo-rt being worse than left)      ASSESSMENT/PLAN:    ICD-10-CM    1. Viral URI  J06.9       2. Eustachian tube dysfunction, bilateral  H69.93 predniSONE (DELTASONE) 10 MG tablet      3. Vertigo  R42 meclizine (ANTIVERT) 25 MG tablet          - Pt did not want any testing done. Low concern for neurological deficit, strep pharyngitis, otitis media, bacterial pneumonia, bronchitis, sinusitis or sepsis.  - Pt to drink lots of fluids  - Pt to take medication as prescribed  - Pt ok to take tylenol as needed  - Pt to call if any symptoms worsen or follow up with PCP if not better in 7 days  - Pt to go to ER if have shortness of breath, chest pain, sudden fever or lethargy    Discussed PCP follow up for persisting or worsening symptoms, or to return to the clinic if unable to obtain PCP follow up for worsening symptoms.    The patient tolerated their visit well.      SUBJECTIVE/OBJECTIVE:  HPI:   64 y.o. female presents for complaint of pt states she started 3 days ago with nasal congestion and vertigo.    Admits bilateral ear pain, chills, nasal congestion, cough and headache.  Denies fever, abdominal pain, vomiting, diarrhea, trouble talking, trouble walking, confusion or weakness in arms or legs.    Has taken benadryl at home. No known sick contacts.     Patient provided the HPI by themself - the patient is considered to be a reliable historian.        History provided by:  Patient  Language interpreter used: No        VITAL SIGNS  Vitals:    09/01/23 1822   BP: 137/79   Pulse: 98   Temp: 97.9 F (36.6 C)   TempSrc: Oral   SpO2: 95%   Weight: 125.2 kg (276 lb)   Height: 1.702 m (5\' 7" )       Review of Systems   Constitutional:  Positive for chills and fatigue. Negative for fever.   HENT:  Positive for  congestion and sore throat.    Eyes:  Negative for pain, discharge, redness and visual disturbance.   Respiratory:  Positive for cough. Negative for shortness of breath.    Cardiovascular:  Negative for chest pain.   Gastrointestinal:  Negative for abdominal pain, diarrhea, nausea and vomiting.   Skin:  Negative for rash.   Neurological:  Positive for dizziness and headaches.   Psychiatric/Behavioral:  Negative for confusion.      Pertinent positives and negatives as above, or may be included within the HPI.    Physical Exam  Vitals and nursing note reviewed.   Constitutional:       General: She is not in acute distress.     Appearance: Normal appearance. She is not ill-appearing, toxic-appearing or diaphoretic.      Interventions: She is not intubated.  HENT:      Head: Normocephalic and atraumatic.      Right Ear: Hearing, ear canal and external ear normal. A middle ear effusion is present. There is no impacted cerumen.      Left Ear: Hearing, ear canal and external ear normal.  A middle ear effusion is present. There is no impacted cerumen.      Nose: Congestion present.      Mouth/Throat:      Lips: Pink. No lesions.      Mouth: Mucous membranes are moist. No injury, lacerations, oral lesions or angioedema.      Tongue: No lesions. Tongue does not deviate from midline.      Palate: No mass and lesions.      Pharynx: Oropharynx is clear. Uvula midline. No pharyngeal swelling, oropharyngeal exudate, posterior oropharyngeal erythema, uvula swelling or postnasal drip.      Tonsils: No tonsillar exudate or tonsillar abscesses. 0 on the right. 0 on the left.   Eyes:      Conjunctiva/sclera: Conjunctivae normal.      Pupils: Pupils are equal, round, and reactive to light.   Cardiovascular:      Rate and Rhythm: Normal rate and regular rhythm.      Pulses: Normal pulses.      Heart sounds: Normal heart sounds.   Pulmonary:      Effort: Pulmonary effort is normal. No tachypnea, bradypnea, accessory muscle usage, prolonged  expiration, respiratory distress or retractions. She is not intubated.      Breath sounds: Normal breath sounds and air entry. No stridor, decreased air movement or transmitted upper airway sounds. No decreased breath sounds, wheezing, rhonchi or rales.   Chest:      Chest wall: No tenderness.   Musculoskeletal:      Cervical back: Normal range of motion and neck supple.   Lymphadenopathy:      Cervical: No cervical adenopathy.   Skin:     General: Skin is warm and dry.   Neurological:      General: No focal deficit present.      Mental Status: She is alert and oriented to person, place, and time.   Psychiatric:         Mood and Affect: Mood normal.         Behavior: Behavior normal.         Thought Content: Thought content normal.         Judgment: Judgment normal.         PROCEDURES:  Unless otherwise noted below, none     Procedures    RESULTS:  No results found for this visit on 09/01/23.      An electronic signature was used to authenticate this note.  --Adria Devon, PA-C

## 2023-09-01 NOTE — Patient Instructions (Signed)
-   Pt to drink lots of fluids  - Pt to take medication as prescribed  - Pt ok to take tylenol and ibuprofen as needed  - Pt to call if any symptoms worsen or follow up with PCP  - Pt to go to ER if have shortness of breath or chest pain

## 2023-09-17 ENCOUNTER — Ambulatory Visit
Admit: 2023-09-17 | Discharge: 2023-09-17 | Payer: PRIVATE HEALTH INSURANCE | Attending: Physician Assistant | Primary: Internal Medicine

## 2023-09-17 VITALS — BP 125/87 | HR 76 | Temp 98.00000°F | Wt 277.0 lb

## 2023-09-17 DIAGNOSIS — H6592 Unspecified nonsuppurative otitis media, left ear: Secondary | ICD-10-CM

## 2023-09-17 MED ORDER — DOXYCYCLINE HYCLATE 100 MG PO TABS
100 | ORAL_TABLET | Freq: Two times a day (BID) | ORAL | 0 refills | Status: AC
Start: 2023-09-17 — End: 2023-09-24

## 2023-09-17 MED ORDER — CETIRIZINE HCL 10 MG PO TABS
10 | ORAL_TABLET | Freq: Every day | ORAL | 0 refills | Status: AC
Start: 2023-09-17 — End: 2023-10-17

## 2023-09-17 NOTE — Patient Instructions (Signed)
-   Pt to drink lots of fluids  - Pt to take medication as prescribed  - Pt ok to take tylenol and ibuprofen as needed  - Pt to call if any symptoms worsen or follow up with PCP  - Pt to go to ER if have shortness of breath or chest pain

## 2023-09-17 NOTE — Progress Notes (Signed)
 Linda Jacobs (DOB: [birth year]) is a 63 y.o. female, Established patient, here for evaluation of the following chief complaint(s):  Ear Pain (Bilateral ear pain, ear drainage, headache behind right eye)      ASSESSMENT/PLAN:    ICD-10-CM    1. Left non-suppurative otitis media  H65.92 doxycycline hyclate (VIBRA-TABS) 100 MG tablet     cetirizine (ZYRTEC) 10 MG tablet      2. Acute non-recurrent frontal sinusitis  J01.10 doxycycline hyclate (VIBRA-TABS) 100 MG tablet     cetirizine (ZYRTEC) 10 MG tablet          - Pt did not want any testing done. Low concern for strep pharyngitis, otitis externa, bacterial pneumonia, bronchitis or sepsis.  - Pt to drink lots of fluids  - Pt to take medication as prescribed  - Pt ok to take tylenol and ibuprofen as needed  - Pt to call if any symptoms worsen or follow up with PCP if not better in 7 days  - Pt to go to ER if have shortness of breath, chest pain, sudden fever or lethargy    Discussed PCP follow up for persisting or worsening symptoms, or to return to the clinic if unable to obtain PCP follow up for worsening symptoms.    The patient tolerated their visit well.      SUBJECTIVE/OBJECTIVE:  HPI:   63 y.o. female presents for complaint of pt states she started in the last two weeks with bilateral ear pain and fullness.    Admits sore throat, nasal congestion, headache and mild cough.  Denies fever, body aches, abdominal pain, vomiting or diarrhea.     Has taken flonase at home.     Patient provided the HPI by themself - the patient is considered to be a reliable historian.        History provided by:  Patient  Language interpreter used: No        VITAL SIGNS  Vitals:    09/17/23 1008   BP: 125/87   Pulse: 76   Temp: 98 F (36.7 C)   SpO2: 94%   Weight: 125.6 kg (277 lb)       Review of Systems   Constitutional:  Negative for chills, fatigue and fever.   HENT:  Positive for congestion and sore throat.    Eyes:  Negative for pain, discharge, redness and visual disturbance.    Respiratory:  Positive for cough. Negative for shortness of breath.    Cardiovascular:  Negative for chest pain.   Gastrointestinal:  Negative for abdominal pain, diarrhea, nausea and vomiting.   Skin:  Negative for rash.   Neurological:  Positive for headaches.   Psychiatric/Behavioral:  Negative for confusion.      Pertinent positives and negatives as above, or may be included within the HPI.    Physical Exam  Vitals and nursing note reviewed.   Constitutional:       General: She is not in acute distress.     Appearance: Normal appearance. She is not ill-appearing, toxic-appearing or diaphoretic.      Interventions: She is not intubated.  HENT:      Head: Normocephalic and atraumatic.      Comments: Pt has tenderness over frontal sinuses     Right Ear: Hearing, ear canal and external ear normal. A middle ear effusion is present. There is no impacted cerumen. Tympanic membrane is erythematous.      Left Ear: Hearing, ear canal and external ear normal. A middle ear  effusion is present. There is no impacted cerumen.      Nose: Congestion present.      Mouth/Throat:      Lips: Pink. No lesions.      Mouth: Mucous membranes are moist. No injury, lacerations, oral lesions or angioedema.      Tongue: No lesions. Tongue does not deviate from midline.      Palate: No mass and lesions.      Pharynx: Oropharynx is clear. Uvula midline. No pharyngeal swelling, oropharyngeal exudate, posterior oropharyngeal erythema, uvula swelling or postnasal drip.      Tonsils: No tonsillar exudate or tonsillar abscesses. 0 on the right. 0 on the left.   Eyes:      Conjunctiva/sclera: Conjunctivae normal.      Pupils: Pupils are equal, round, and reactive to light.   Cardiovascular:      Rate and Rhythm: Normal rate and regular rhythm.      Pulses: Normal pulses.      Heart sounds: Normal heart sounds.   Pulmonary:      Effort: Pulmonary effort is normal. No tachypnea, bradypnea, accessory muscle usage, prolonged expiration, respiratory  distress or retractions. She is not intubated.      Breath sounds: Normal breath sounds and air entry. No stridor, decreased air movement or transmitted upper airway sounds. No decreased breath sounds, wheezing, rhonchi or rales.   Chest:      Chest wall: No tenderness.   Musculoskeletal:      Cervical back: Normal range of motion and neck supple.   Lymphadenopathy:      Cervical: No cervical adenopathy.   Skin:     General: Skin is warm and dry.   Neurological:      General: No focal deficit present.      Mental Status: She is alert and oriented to person, place, and time.   Psychiatric:         Mood and Affect: Mood normal.         Behavior: Behavior normal.         Thought Content: Thought content normal.         Judgment: Judgment normal.         PROCEDURES:  Unless otherwise noted below, none     Procedures    RESULTS:  No results found for this visit on 09/17/23.      An electronic signature was used to authenticate this note.  --Adria Devon, PA-C
